# Patient Record
Sex: Male | Born: 1975 | Race: White | Hispanic: No | Marital: Married | Smoking: Current every day smoker
Health system: Southern US, Community
[De-identification: ages and names within clinical notes are randomized; demographics above are authoritative.]

## PROBLEM LIST (undated history)

## (undated) DIAGNOSIS — E119 Type 2 diabetes mellitus without complications: Secondary | ICD-10-CM

---

## 2020-10-09 ENCOUNTER — Inpatient Hospital Stay (HOSPITAL_BASED_OUTPATIENT_CLINIC_OR_DEPARTMENT_OTHER)
Admission: EM | Admit: 2020-10-09 | Discharge: 2020-10-10 | DRG: 066 | Disposition: A | Discharge status: Home / Self Care | Payer: Commercial Managed Care - PPO | Attending: Neurology | Admitting: Neurology

## 2020-10-09 ENCOUNTER — Emergency Department (HOSPITAL_BASED_OUTPATIENT_CLINIC_OR_DEPARTMENT_OTHER): Payer: Commercial Managed Care - PPO

## 2020-10-09 ENCOUNTER — Other Ambulatory Visit: Payer: Self-pay

## 2020-10-09 ENCOUNTER — Encounter (HOSPITAL_BASED_OUTPATIENT_CLINIC_OR_DEPARTMENT_OTHER): Payer: Self-pay | Admitting: *Deleted

## 2020-10-09 DIAGNOSIS — R270 Ataxia, unspecified: Secondary | ICD-10-CM

## 2020-10-09 DIAGNOSIS — I10 Essential (primary) hypertension: Secondary | ICD-10-CM | POA: Diagnosis present

## 2020-10-09 DIAGNOSIS — I651 Occlusion and stenosis of basilar artery: Secondary | ICD-10-CM | POA: Diagnosis not present

## 2020-10-09 DIAGNOSIS — I6381 Other cerebral infarction due to occlusion or stenosis of small artery: Principal | ICD-10-CM | POA: Diagnosis present

## 2020-10-09 DIAGNOSIS — R4789 Other speech disturbances: Secondary | ICD-10-CM

## 2020-10-09 DIAGNOSIS — I639 Cerebral infarction, unspecified: Secondary | ICD-10-CM

## 2020-10-09 DIAGNOSIS — F17210 Nicotine dependence, cigarettes, uncomplicated: Secondary | ICD-10-CM | POA: Diagnosis present

## 2020-10-09 DIAGNOSIS — E119 Type 2 diabetes mellitus without complications: Secondary | ICD-10-CM | POA: Diagnosis present

## 2020-10-09 DIAGNOSIS — I6389 Other cerebral infarction: Secondary | ICD-10-CM | POA: Diagnosis not present

## 2020-10-09 DIAGNOSIS — R29701 NIHSS score 1: Secondary | ICD-10-CM | POA: Diagnosis present

## 2020-10-09 DIAGNOSIS — Z20822 Contact with and (suspected) exposure to covid-19: Secondary | ICD-10-CM | POA: Diagnosis present

## 2020-10-09 DIAGNOSIS — I6330 Cerebral infarction due to thrombosis of unspecified cerebral artery: Secondary | ICD-10-CM | POA: Insufficient documentation

## 2020-10-09 DIAGNOSIS — R4701 Aphasia: Secondary | ICD-10-CM | POA: Diagnosis present

## 2020-10-09 DIAGNOSIS — Z8249 Family history of ischemic heart disease and other diseases of the circulatory system: Secondary | ICD-10-CM

## 2020-10-09 HISTORY — DX: Type 2 diabetes mellitus without complications: E11.9

## 2020-10-09 LAB — COMPREHENSIVE METABOLIC PANEL
ALT: 19 U/L (ref 0–44)
AST: 17 U/L (ref 15–41)
Albumin: 4 g/dL (ref 3.5–5.0)
Alkaline Phosphatase: 47 U/L (ref 38–126)
Anion gap: 7 (ref 5–15)
BUN: 9 mg/dL (ref 6–20)
CO2: 28 mmol/L (ref 22–32)
Calcium: 9.1 mg/dL (ref 8.9–10.3)
Chloride: 102 mmol/L (ref 98–111)
Creatinine, Ser: 0.91 mg/dL (ref 0.61–1.24)
GFR, Estimated: >60 mL/min (ref >60)
Glucose, Bld: 160 mg/dL — ABNORMAL HIGH (ref 70–99)
Potassium: 4.4 mmol/L (ref 3.5–5.1)
Sodium: 137 mmol/L (ref 135–145)
Total Bilirubin: 0.6 mg/dL (ref 0.3–1.2)
Total Protein: 7.2 g/dL (ref 6.5–8.1)

## 2020-10-09 LAB — RAPID URINE DRUG SCREEN, HOSP PERFORMED
Amphetamines: NOT DETECTED
Barbiturates: NOT DETECTED
Benzodiazepines: NOT DETECTED
Cocaine: NOT DETECTED
Opiates: NOT DETECTED
Tetrahydrocannabinol: NOT DETECTED

## 2020-10-09 LAB — URINALYSIS, ROUTINE W REFLEX MICROSCOPIC
Bilirubin Urine: NEGATIVE
Glucose, UA: NEGATIVE mg/dL
Hgb urine dipstick: NEGATIVE
Ketones, ur: NEGATIVE mg/dL
Leukocytes,Ua: NEGATIVE
Nitrite: NEGATIVE
Protein, ur: NEGATIVE mg/dL
Specific Gravity, Urine: 1.02 (ref 1.005–1.030)
pH: 6 (ref 5.0–8.0)

## 2020-10-09 LAB — DIFFERENTIAL
Abs Immature Granulocytes: 0.04 10*3/uL (ref 0.00–0.07)
Basophils Absolute: 0.1 10*3/uL (ref 0.0–0.1)
Basophils Relative: 1 %
Eosinophils Absolute: 0.5 10*3/uL (ref 0.0–0.5)
Eosinophils Relative: 4 %
Immature Granulocytes: 0 %
Lymphocytes Relative: 33 %
Lymphs Abs: 3.7 10*3/uL (ref 0.7–4.0)
Monocytes Absolute: 0.7 10*3/uL (ref 0.1–1.0)
Monocytes Relative: 6 %
Neutro Abs: 6.3 10*3/uL (ref 1.7–7.7)
Neutrophils Relative %: 56 %

## 2020-10-09 LAB — CBC
HCT: 49.1 % (ref 39.0–52.0)
Hemoglobin: 16.8 g/dL (ref 13.0–17.0)
MCH: 32.4 pg (ref 26.0–34.0)
MCHC: 34.2 g/dL (ref 30.0–36.0)
MCV: 94.8 fL (ref 80.0–100.0)
Platelets: 211 10*3/uL (ref 150–400)
RBC: 5.18 MIL/uL (ref 4.22–5.81)
RDW: 13.1 % (ref 11.5–15.5)
WBC: 11.3 10*3/uL — ABNORMAL HIGH (ref 4.0–10.5)
nRBC: 0 % (ref 0.0–0.2)

## 2020-10-09 LAB — RESP PANEL BY RT-PCR (FLU A&B, COVID) ARPGX2
Influenza A by PCR: NEGATIVE
Influenza B by PCR: NEGATIVE
SARS Coronavirus 2 by RT PCR: NEGATIVE

## 2020-10-09 LAB — PROTIME-INR
INR: 1 (ref 0.8–1.2)
Prothrombin Time: 13.2 seconds (ref 11.4–15.2)

## 2020-10-09 LAB — HEMOGLOBIN A1C
Hgb A1c MFr Bld: 6.4 % — ABNORMAL HIGH (ref 4.8–5.6)
Mean Plasma Glucose: 136.98 mg/dL

## 2020-10-09 LAB — ETHANOL: Alcohol, Ethyl (B): <10 mg/dL (ref <10)

## 2020-10-09 LAB — CBG MONITORING, ED: Glucose-Capillary: 174 mg/dL — ABNORMAL HIGH (ref 70–99)

## 2020-10-09 LAB — APTT: aPTT: 29 seconds (ref 24–36)

## 2020-10-09 MED ORDER — SENNOSIDES-DOCUSATE SODIUM 8.6-50 MG PO TABS: 1.0000 | ORAL_TABLET | Freq: Every evening | ORAL | Status: DC | PRN

## 2020-10-09 MED ORDER — ASPIRIN EC 325 MG PO TBEC
325.0000 mg | DELAYED_RELEASE_TABLET | Freq: Once | ORAL | Status: AC
  Administered 2020-10-09: 14:00:00 325 mg via ORAL
  Filled 2020-10-09: qty 1

## 2020-10-09 MED ORDER — ENOXAPARIN SODIUM 40 MG/0.4ML ~~LOC~~ SOLN
40.0000 mg | Freq: Every day | SUBCUTANEOUS | Status: DC
  Administered 2020-10-10: 11:00:00 40 mg via SUBCUTANEOUS
  Filled 2020-10-09: qty 0.4

## 2020-10-09 MED ORDER — IOHEXOL 350 MG/ML SOLN
100.0000 mL | Freq: Once | INTRAVENOUS | Status: AC | PRN
  Administered 2020-10-09: 12:00:00 100 mL via INTRAVENOUS

## 2020-10-09 MED ORDER — SODIUM CHLORIDE 0.9 % IV SOLN: INTRAVENOUS | Status: DC

## 2020-10-09 MED ORDER — ACETAMINOPHEN 325 MG PO TABS: 650.0000 mg | ORAL_TABLET | ORAL | Status: DC | PRN

## 2020-10-09 MED ORDER — ACETAMINOPHEN 650 MG RE SUPP: 650.0000 mg | RECTAL | Status: DC | PRN

## 2020-10-09 MED ORDER — CLOPIDOGREL BISULFATE 75 MG PO TABS: 75.0000 mg | ORAL_TABLET | Freq: Every day | ORAL | Status: DC

## 2020-10-09 MED ORDER — ASPIRIN 325 MG PO TABS: 325.0000 mg | ORAL_TABLET | Freq: Every day | ORAL | Status: DC

## 2020-10-09 MED ORDER — ATORVASTATIN CALCIUM 80 MG PO TABS: 80.0000 mg | ORAL_TABLET | Freq: Every day | ORAL | Status: DC

## 2020-10-09 MED ORDER — STROKE: EARLY STAGES OF RECOVERY BOOK
Freq: Once | Status: AC
  Administered 2020-10-10: 05:00:00 1
  Filled 2020-10-09 (×2): qty 1

## 2020-10-09 MED ORDER — ASPIRIN 300 MG RE SUPP
300.0000 mg | Freq: Every day | RECTAL | Status: DC
  Filled 2020-10-09 (×2): qty 1

## 2020-10-09 MED ORDER — ACETAMINOPHEN 160 MG/5ML PO SOLN: 650.0000 mg | ORAL | Status: DC | PRN

## 2020-10-09 MED ORDER — CLOPIDOGREL BISULFATE 300 MG PO TABS
300.0000 mg | ORAL_TABLET | Freq: Once | ORAL | Status: AC
  Administered 2020-10-09: 14:00:00 300 mg via ORAL
  Filled 2020-10-09: qty 1

## 2020-10-09 NOTE — ED Notes (Signed)
Diet Sprite and PB crackers provided to pt

## 2020-10-09 NOTE — ED Notes (Signed)
Attempted to call report; this contact RN contact info provided for callback.

## 2020-10-09 NOTE — Consult Note (Signed)
Requesting Physician: Charm Barges    Chief Complaint: Slurred speech, difficulty with gait  I have been asked by Dr. Charm Barges to see this patient in consultation for code stroke.  HPI: Nazier Neyhart is an 44 y.o. male with a history of tobacco abuse and diet controlled DM who reports that last night had the acute onset of fatigue with gait imbalance, bilateral upper extremity weakness, slurred speech and pressure behind the left eye.  With no improvement in symptoms presented to the ED for further evaluation where code stroke was called.  Initial NIHSS of 1.    Date last known well: Date: 10/09/2020 Time last known well: Time: 01:30 tPA Given: No: Outside time window  Past Medical History:  Diagnosis Date  . Diabetes mellitus without complication (HCC)     History reviewed. No pertinent surgical history.  Family history: Father deceased.  Mother alive with HTN  Social History:  reports that he has been smoking cigarettes. He has been smoking about 0.50 packs per day. He has never used smokeless tobacco. He reports that he does not drink alcohol and does not use drugs.  Allergies: No Known Allergies  Medications: No anticoagulation Prior to Admission medications   Not on File    ROS: History obtained from the patient  General ROS: as noted in HPI Psychological ROS: negative for - behavioral disorder, hallucinations, memory difficulties, mood swings or suicidal ideation Ophthalmic ROS: as noted in HPI ENT ROS: negative for - epistaxis, nasal discharge, oral lesions, sore throat, tinnitus or vertigo Allergy and Immunology ROS: negative for - hives or itchy/watery eyes Hematological and Lymphatic ROS: negative for - bleeding problems, bruising or swollen lymph nodes Endocrine ROS: negative for - galactorrhea, hair pattern changes, polydipsia/polyuria or temperature intolerance Respiratory ROS: negative for - cough, hemoptysis, shortness of breath or wheezing Cardiovascular ROS: negative  for - chest pain, dyspnea on exertion, edema or irregular heartbeat Gastrointestinal ROS: negative for - abdominal pain, diarrhea, hematemesis, nausea/vomiting or stool incontinence Genito-Urinary ROS: negative for - dysuria, hematuria, incontinence or urinary frequency/urgency Musculoskeletal ROS: negative for - joint swelling or muscular weakness Neurological ROS: as noted in HPI Dermatological ROS: negative for rash and skin lesion changes  Physical Examination: Blood pressure (!) 130/96, pulse 80, temperature (!) 97.4 F (36.3 C), resp. rate 16, height 5\' 8"  (1.727 m), weight 86.2 kg, SpO2 99 %.  HEENT-  Normocephalic, no lesions, without obvious abnormality.  Normal external eye and conjunctiva.  Normal external nose.  Neurological Examination   Mental Status: Alert, oriented, thought content appropriate.  Frequent halting of speech.  Able to follow commands.   Cranial Nerves: II: Visual fields grossly normal III,IV, VI: Extra-ocular motions intact bilaterally V,VII: smile symmetric, facial light touch sensation normal bilaterally VIII: hearing normal bilaterally XI: bilateral shoulder shrug XII: midline tongue extension Motor: Able to loft all extremities against gravity with no focal weakness appreciated Sensory: Pinprick and light touch intact throughout, bilaterally Cerebellar: normal finger-to-nose and normal heel-to-shin testing bilaterally   NIHSS of 1 for speech    Laboratory Studies:  Basic Metabolic Panel: No results for input(s): NA, K, CL, CO2, GLUCOSE, BUN, CREATININE, CALCIUM, MG, PHOS in the last 168 hours.  Liver Function Tests: No results for input(s): AST, ALT, ALKPHOS, BILITOT, PROT, ALBUMIN in the last 168 hours. No results for input(s): LIPASE, AMYLASE in the last 168 hours. No results for input(s): AMMONIA in the last 168 hours.  CBC: No results for input(s): WBC, NEUTROABS, HGB, HCT, MCV, PLT in the  last 168 hours.  Cardiac Enzymes: No results  for input(s): CKTOTAL, CKMB, CKMBINDEX, TROPONINI in the last 168 hours.  BNP: Invalid input(s): POCBNP  CBG: Recent Labs  Lab 10/09/20 1014  GLUCAP 174*    Microbiology: No results found for this or any previous visit.  Coagulation Studies: No results for input(s): LABPROT, INR in the last 72 hours.  Urinalysis: No results for input(s): COLORURINE, LABSPEC, PHURINE, GLUCOSEU, HGBUR, BILIRUBINUR, KETONESUR, PROTEINUR, UROBILINOGEN, NITRITE, LEUKOCYTESUR in the last 168 hours.  Invalid input(s): APPERANCEUR  Lipid Panel: No results found for: CHOL, TRIG, HDL, CHOLHDL, VLDL, LDLCALC  HgbA1C: No results found for: HGBA1C  Urine Drug Screen:  No results found for: LABOPIA, COCAINSCRNUR, LABBENZ, AMPHETMU, THCU, LABBARB  Alcohol Level: No results for input(s): ETH in the last 168 hours.  Other results: EKG: sinus rhythm at 80 bpm  Imaging: CT HEAD CODE STROKE WO CONTRAST  Result Date: 10/09/2020 CLINICAL DATA:  Code stroke. EXAM: CT HEAD WITHOUT CONTRAST TECHNIQUE: Contiguous axial images were obtained from the base of the skull through the vertex without intravenous contrast. COMPARISON:  None. FINDINGS: Brain: No evidence of acute infarction, hemorrhage, hydrocephalus, extra-axial collection or mass lesion/mass effect. Vascular: No hyperdense vessel or unexpected calcification. Skull: Normal. Negative for fracture or focal lesion. Sinuses/Orbits: No acute finding. Other: None. ASPECTS [facility] Stroke Program Early CT Score) - Ganglionic level infarction (caudate, lentiform nuclei, internal capsule, insula, M1-M3 cortex): 7 - Supraganglionic infarction (M4-M6 cortex): 3 Total score (0-10 with 10 being normal): 10 IMPRESSION: 1. No acute intracranial pathology. 2. ASPECTS is 10. These results were called by telephone at the time of interpretation on 10/09/2020 at 10:38 am to provider [facility] , who verbally acknowledged these results. Electronically Signed   By: Baldemar Lenis M.D.   On: 10/09/2020 10:40    Assessment: 44 y.o. male with a history of tobacco abuse and diet controlled DM who reports that last night had the acute onset of fatigue with gait imbalance, bilateral upper extremity weakness, slurred speech and pressure behind the left eye.  With no improvement in symptoms presented to the ED for further evaluation where code stroke was called.  Initial NIHSS of 1.   Patient outside time window for tPA but due to presenting complaints can not rule out the possibility of a posterior circulation thrombosis.  Further imaging indicated.    Stroke Risk Factors - diabetes mellitus  Plan: 1. HgbA1c, fasting lipid panel 2. CTA of the head and neck STAT 3. Echocardiogram 4. Prophylactic therapy-ASA 325 and Plavix 300mg  now.   5. Telemetry monitoring 6. Frequent neuro checks 7. If no evidence of LVO on CTA  Would proceed with MRI of the brain without contrast and prophylactic therapy  ASA 81mg  and Plavix 75mg  for three weeks with change to ASA 81mg  daily alone as monotherapy after that time.   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  Case discussed with Dr. who has recommended a cerebral arteriogram for tomorrow.  Case also discussed with neurology at [facility] who will accept in transfer (Dr. Thana Farr).    Case discussed with Dr. 10/11/2020  This patient was evaluated using a telemedicine platform with verbal consent from patient prior to evaluation.  115 minutes was spent in consultation with this patient, family and involved caregivers with development of management and treatment plans discussed.  Thana Farr, MD Neurology

## 2020-10-09 NOTE — Progress Notes (Signed)
Patient arrived from [facility] ED by transport to unit A&Ox4 without pain. Patient states numbness & tingling resolved but remains feeling mildly uncoordinated in upper extremities. Page sent to [facility] admission but was informed the patient is admitted by neurology. Oncall neurology paged, awaiting response.

## 2020-10-09 NOTE — H&P (Addendum)
NEUROLOGY H & P NOTE   Date of service: October 09, 2020 Patient Name: Fred Hughes MRN:  [id number] DOB:  29-May-1976 _ _ _   _ __   _ __ _ _  __ __   _ __   __ _  History of Present Illness  Fred Hughes is a 44 y.o. male with PMH significant for DM2 who presents with acute onset incoordination and slurred speech with a LKW of 0130 on 10/09/20.  He was seen on telemetry neurology as a stroke code.  He was outside tPA window. 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  NIH stroke scale was a 1.  Patient reports that he was feeling lethargic and lightheaded last night.  He went to bed at 0130 on 10/09/2020 and woke up with slurred speech and incoordination.  He came into the emergency department when his symptoms persisted. His symptoms have completely resolved.  Transferred to [facility] for further work-up and potential angiogram.  NIH stroke scale: 0 MR S: 0 Last known well: 0130 on 10/09/2020.   tPA: Outside the window. Thrombectomy: Not a candidate due to low NIH stroke scale.  ROS   Constitutional Denies weight loss, fever and chills.   HEENT Denies changes in vision and hearing.   Respiratory Denies SOB and cough.   CV Denies palpitations and CP   GI Denies abdominal pain, nausea, vomiting and diarrhea.   GU Denies dysuria and urinary frequency.   MSK Denies myalgia and joint pain.   Skin Denies rash and pruritus.   Neurological Denies headache and syncope.   Psychiatric Denies recent changes in mood. Denies anxiety and depression.    Past History   Past Medical History:  Diagnosis Date  . Diabetes mellitus without complication (HCC)    History reviewed. No pertinent surgical history. History reviewed. No pertinent family history. Social History   Socioeconomic History  . Marital status: Married    Spouse name: Not on file  . Number of children: Not on file  . Years of education: Not on file  . Highest education level:  Not on file  Occupational History  . Not on file  Tobacco Use  . Smoking status: Current Every Day Smoker    Packs/day: 0.50    Types: Cigarettes  . Smokeless tobacco: Never Used  Substance and Sexual Activity  . Alcohol use: Never  . Drug use: Never  . Sexual activity: Yes  Other Topics Concern  . Not on file  Social History Narrative  . Not on file   Social Determinants of Health   Financial Resource Strain: Not on file  Food Insecurity: Not on file  Transportation Needs: Not on file  Physical Activity: Not on file  Stress: Not on file  Social Connections: Not on file   No Known Allergies  Medications   Medications Prior to Admission  Medication Sig Dispense Refill Last Dose  . famotidine-calcium carbonate-magnesium hydroxide (PEPCID COMPLETE) 10-800-165 MG chewable tablet Chew 1 tablet by mouth daily as needed.   10/08/2020 at Unknown time  . naproxen sodium (ALEVE) 220 MG tablet Take 220 mg by mouth daily as needed.        Vitals   Vitals:   10/09/20 1630 10/09/20 1700 10/09/20 1830 10/09/20 2006  BP: (!) 114/92 113/84 113/82 (!) 142/95  Pulse: 86 81 81 86  Resp: 16 (!) 24 (!) 21 18  Temp:    98.5 F (36.9 C)  SpO2: 100% 97% 97% 100%  Weight:      Height:         Body mass index is 28.89 kg/m.  Physical Exam   General: Laying comfortably in bed; in no acute distress.  HENT: Normal oropharynx and mucosa. Normal external appearance of ears and nose.  Neck: Supple, no pain or tenderness  CV: No JVD. No peripheral edema.  Pulmonary: Symmetric Chest rise. Normal respiratory effort.  Abdomen: Soft to touch, non-tender.  Ext: No cyanosis, edema, or deformity  Skin: No rash. Normal palpation of skin.   Musculoskeletal: Normal digits and nails by inspection. No clubbing.   Neurologic Examination  Mental status/Cognition: Alert, oriented to self, place, month and year, good attention.  Speech/language: Fluent, comprehension intact, object naming intact,  repetition intact.  Cranial nerves:   CN II Pupils equal and reactive to light, no VF deficits    CN III,IV,VI EOM intact, no gaze preference or deviation, no nystagmus    CN V normal sensation in V1, V2, and V3 segments bilaterally    CN VII no asymmetry, no nasolabial fold flattening    CN VIII normal hearing to speech    CN IX & X normal palatal elevation, no uvular deviation    CN XI 5/5 head turn and 5/5 shoulder shrug bilaterally    CN XII midline tongue protrusion    Motor:  Muscle bulk: normal, tone normal, pronator drift none  tremor none Mvmt Root Nerve  Muscle Right Left Comments  SA C5/6 Ax Deltoid 5 5   EF C5/6 Mc Biceps 5 5   EE C6/7/8 Rad Triceps 5 5   WF C6/7 Med FCR 5 5   WE C7/8 PIN ECU 5 5   F Ab C8/T1 U ADM/FDI 5 5   HF L1/2/3 Fem Illopsoas 5 5   KE L2/3/4 Fem Quad 5 5   DF L4/5 D Peron Tib Ant 5 5   PF S1/2 Tibial Grc/Sol 5 5    Reflexes:  Right Left Comments  Pectoralis      Biceps (C5/6) 2 2   Brachioradialis (C5/6) 2 2    Triceps (C6/7) 1 1    Patellar (L3/4) 2 2    Achilles (S1) 1 1    Hoffman      Plantar     Jaw jerk    Sensation:  Light touch Intact throughout   Pin prick    Temperature    Vibration   Proprioception    Coordination/Complex Motor:  - Finger to Nose intact BL - Heel to shin intact BL - Rapid alternating movement are normal - Gait: deferred.  NIHSS components Score: Comment  1a Level of Conscious 0[x]  1[]  2[]  3[]      1b LOC Questions 0[x]  1[]  2[]       1c LOC Commands 0[x]  1[]  2[]       2 Best Gaze 0[x]  1[]  2[]       3 Visual 0[x]  1[]  2[]  3[]      4 Facial Palsy 0[x]  1[]  2[]  3[]      5a Motor Arm - left 0[x]  1[]  2[]  3[]  4[]  UN[]    5b Motor Arm - Right 0[x]  1[]  2[]  3[]  4[]  UN[]    6a Motor Leg - Left 0[x]  1[]  2[]  3[]  4[]  UN[]    6b Motor Leg - Right 0[x]  1[]  2[]  3[]  4[]  UN[]    7 Limb Ataxia 0[x]  1[]  2[]  3[]  UN[]     8 Sensory 0[x]  1[]  2[]  UN[]      9 Best  Language 0[x]  1[]  2[]  3[]      10 Dysarthria 0[x]  1[]  2[]  UN[]       11 Extinct. and Inattention 0[x]  1[]  2[]       TOTAL: 0      Labs   CBC:  Recent Labs  Lab 10/09/20 1019  WBC 11.3*  NEUTROABS 6.3  HGB 16.8  HCT 49.1  MCV 94.8  PLT 211    Basic Metabolic Panel:  Lab Results  Component Value Date   NA 137 10/09/2020   K 4.4 10/09/2020   CO2 28 10/09/2020   GLUCOSE 160 (H) 10/09/2020   BUN 9 10/09/2020   CREATININE 0.91 10/09/2020   CALCIUM 9.1 10/09/2020   GFRNONAA >60 10/09/2020   Lipid Panel: No results found for: LDLCALC HgbA1c:  Lab Results  Component Value Date   HGBA1C 6.4 (H) 10/09/2020   Urine Drug Screen:     Component Value Date/Time   LABOPIA NONE DETECTED 10/09/2020 1050   COCAINSCRNUR NONE DETECTED 10/09/2020 1050   LABBENZ NONE DETECTED 10/09/2020 1050   AMPHETMU NONE DETECTED 10/09/2020 1050   THCU NONE DETECTED 10/09/2020 1050   LABBARB NONE DETECTED 10/09/2020 1050    Alcohol Level     Component Value Date/Time   ETH <10 10/09/2020 1136    CT Head without contrast: CTH was negative for a large hypodensity concerning for a large territory infarct or hyperdensity concerning for an ICH  CT angio Head and Neck with contrast: 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  MRI Brain pending   Impression   Fred Hughes is a 44 y.o. 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  In terms are concerning for potential TIA.  We will get stroke work-up and a CT angiogram to assess the extent of the stenosis.  Recommendations  Plan:  I ordered following: - Frequent Neuro checks per stroke unit protocol - MRI Brain without contrast - TTE pending - Lipid panel with LDL - will start statins if LDL > 70 - HbA1c - Antithrombotic - Aspirin 81mg  and plavix 75mg  daily x 3 months, then aspirin 81mg  daily alone. DVT PPX - SBP goal:  permissive hypertension first 24 h < 220/110. Held home meds.  - Telemetry monitoring for arrythmia - Bedside swallow screen prior to PO intake. - Stroke education booklet - PT/OT/SLP consult - Strict bedrest for 24 hours - IV Fluids at 67ml/hr. - NPO midnight for potential angiogram in AM - Will need to consult Neurointerventional Radiology in AM  ____________________________________________________________________  Signed,  [facility] Pager Number _ _ _   _ __   _ __ _ _  __ __   _ __   __ _

## 2020-10-09 NOTE — ED Triage Notes (Signed)
Had weakness & vertigo last night.  Patient noticed slurred speech this morning and numbness to his lips and arms.

## 2020-10-09 NOTE — ED Notes (Signed)
Hgb A1c added to previous blood draw.  Lab aware.

## 2020-10-09 NOTE — ED Provider Notes (Signed)
[facility] EMERGENCY DEPARTMENT Provider Note   CSN: [id number] Arrival date & time: 10/09/20  1002     History Chief Complaint  Patient presents with  . Aphasia  . Numbness    Fred Hughes is a 44 y.o. male.  He has a history of diabetes and is diet controlled.  He said around 1:30 AM today he felt acutely fatigued and had some imbalance walking.  Same when he woke up this morning also with some word finding difficulties.  He said he feels weak in his arms and they take more effort to lift.  Also notices some tingling around his mouth.  Some sinus pressure behind his left eye.  No blurry vision double vision.  No head injury.  No prior neurologic history. Code stroke activated as possible LVO.  The history is provided by the patient.  Cerebrovascular Accident The current episode started 6 to 12 hours ago. The problem occurs constantly. The problem has not changed since onset.Associated symptoms include headaches. Pertinent negatives include no chest pain, no abdominal pain and no shortness of breath. Nothing aggravates the symptoms. Nothing relieves the symptoms. He has tried nothing for the symptoms. The treatment provided no relief.       Past Medical History:  Diagnosis Date  . Diabetes mellitus without complication (HCC)     There are no problems to display for this patient.   History reviewed. No pertinent surgical history.     History reviewed. No pertinent family history.  Social History   Tobacco Use  . Smoking status: Current Every Day Smoker    Packs/day: 0.50    Types: Cigarettes  . Smokeless tobacco: Never Used  Substance Use Topics  . Alcohol use: Never  . Drug use: Never    Home Medications Prior to Admission medications   Not on File    Allergies    Patient has no known allergies.  Review of Systems   Review of Systems  Constitutional: Negative for fever.  HENT: Negative for sore throat.   Eyes: Negative for visual disturbance.   Respiratory: Negative for shortness of breath.   Cardiovascular: Negative for chest pain.  Gastrointestinal: Negative for abdominal pain.  Genitourinary: Negative for dysuria.  Musculoskeletal: Negative for neck pain.  Skin: Negative for rash.  Neurological: Positive for dizziness, speech difficulty, weakness, numbness and headaches. Negative for facial asymmetry.    Physical Exam Updated Vital Signs BP (!) 130/96 (BP Location: Right Arm)   Pulse 80   Temp (!) 97.4 F (36.3 C)   Resp 16   Ht 5\' 8"  (1.727 m)   Wt 86.2 kg   SpO2 99%   BMI 28.89 kg/m   Physical Exam Vitals and nursing note reviewed.  Constitutional:      Appearance: Normal appearance. He is well-developed and well-nourished.  HENT:     Head: Normocephalic and atraumatic.  Eyes:     Conjunctiva/sclera: Conjunctivae normal.  Cardiovascular:     Rate and Rhythm: Normal rate and regular rhythm.     Heart sounds: No murmur heard.   Pulmonary:     Effort: Pulmonary effort is normal. No respiratory distress.     Breath sounds: Normal breath sounds.  Abdominal:     Palpations: Abdomen is soft.     Tenderness: There is no abdominal tenderness.  Musculoskeletal:        General: No edema.     Cervical back: Neck supple.  Skin:    General: Skin is warm and dry.  Neurological:     Mental Status: He is alert.     Sensory: No sensory deficit.     Motor: No weakness.     Comments: Patient is awake and alert.  Cranial nerves II through XII intact other than has some difficulty with word finding.  Strength 5 out of 5 upper and lower extremities.  Finger-to-nose preserved although patient says it feels slightly difficult to do.  Gait deferred.  Psychiatric:        Mood and Affect: Mood and affect normal.     ED Results / Procedures / Treatments   Labs (all labs ordered are listed, but only abnormal results are displayed) Labs Reviewed  CBC - Abnormal; Notable for the following components:      Result Value    WBC 11.3 (*)    All other components within normal limits  COMPREHENSIVE METABOLIC PANEL - Abnormal; Notable for the following components:   Glucose, Bld 160 (*)    All other components within normal limits  CBG MONITORING, ED - Abnormal; Notable for the following components:   Glucose-Capillary 174 (*)    All other components within normal limits  RESP PANEL BY RT-PCR (FLU A&B, COVID) ARPGX2  URINALYSIS, ROUTINE W REFLEX MICROSCOPIC  ETHANOL  PROTIME-INR  APTT  DIFFERENTIAL  RAPID URINE DRUG SCREEN, HOSP PERFORMED  HEMOGLOBIN A1C    EKG EKG Interpretation  Date/Time:  Monday October 09 2020 10:12:16 EST Ventricular Rate:  80 PR Interval:    QRS Duration: 94 QT Interval:  366 QTC Calculation: 423 R Axis:   18 Text Interpretation: Sinus rhythm No old tracing to compare Confirmed by Meridee Score [phone]) on 10/09/2020 10:35:17 AM   Radiology CT HEAD CODE STROKE WO CONTRAST  Result Date: 10/09/2020 CLINICAL DATA:  Code stroke. EXAM: CT HEAD WITHOUT CONTRAST TECHNIQUE: Contiguous axial images were obtained from the base of the skull through the vertex without intravenous contrast. COMPARISON:  None. FINDINGS: Brain: No evidence of acute infarction, hemorrhage, hydrocephalus, extra-axial collection or mass lesion/mass effect. Vascular: No hyperdense vessel or unexpected calcification. Skull: Normal. Negative for fracture or focal lesion. Sinuses/Orbits: No acute finding. Other: None. ASPECTS [facility] Stroke Program Early CT Score) - Ganglionic level infarction (caudate, lentiform nuclei, internal capsule, insula, M1-M3 cortex): 7 - Supraganglionic infarction (M4-M6 cortex): 3 Total score (0-10 with 10 being normal): 10 IMPRESSION: 1. No acute intracranial pathology. 2. ASPECTS is 10. These results were called by telephone at the time of interpretation on 10/09/2020 at 10:38 am to provider [facility] , who verbally acknowledged these results. Electronically Signed   By: Baldemar Lenis M.D.   On: 10/09/2020 10:40   CT ANGIO HEAD CODE STROKE  Result Date: 10/09/2020 CLINICAL DATA:  Stroke suspected. Slurred speech. Numbness to lips and arms. EXAM: CT ANGIOGRAPHY HEAD AND NECK TECHNIQUE: Multidetector CT imaging of the head and neck was performed using the standard protocol during bolus administration of intravenous contrast. Multiplanar CT image reconstructions and MIPs were obtained to evaluate the vascular anatomy. Carotid stenosis measurements (when applicable) are obtained utilizing NASCET criteria, using the distal internal carotid diameter as the denominator. CONTRAST:  OMNIPAQUE IOHEXOL 350 MG/ML SOLN COMPARISON:  Same day head CT. FINDINGS: CTA NECK FINDINGS Aortic arch: Evaluation is in by streak artifact. Great vessel origins are patent. Right carotid system: No evidence of dissection, stenosis (50% or greater) or occlusion. Left carotid system: No evidence of dissection, stenosis (50% or greater) or occlusion. Vertebral arteries: Left dominant. The  right vertebral artery is diminutive throughout its course. No evidence of dissection, stenosis (50% or greater) or occlusion. Skeleton: Focal moderate to severe no acute findings. Degenerative disc disease at C6-C7. Other neck: No mass or suspicious adenopathy. Upper chest: No acute findings. Dependent atelectasis without consolidation. Review of the MIP images confirms the above findings CTA HEAD FINDINGS Anterior circulation: No evidence of large vessel occlusion or proximal hemodynamically significant stenosis. Evaluation of the more distal vasculature is somewhat limited by venous contamination. No aneurysm. Posterior circulation: Intradural vertebral arteries are patent. 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 Bilateral posterior cerebral arteries are patent without evidence of occlusion or hemodynamically significant stenosis proximally. Somewhat  limited evaluation of more distal PCA secondary to venous contamination. The right P1 PCA is somewhat small, favored to relate to fetal like right PCA with right posterior communicating artery. No aneurysm. Venous sinuses: As permitted by contrast timing, patent. Review of the MIP images confirms the above findings IMPRESSION: 1. 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 2. No evidence of hemodynamically significant stenosis in the neck. Findings discussed with Dr. Charm Barges at 10:42 p.m. via telephone. Electronically Signed   By: Feliberto Harts MD   On: 10/09/2020 12:47   CT ANGIO NECK CODE STROKE  Result Date: 10/09/2020 CLINICAL DATA:  Stroke suspected. Slurred speech. Numbness to lips and arms. EXAM: CT ANGIOGRAPHY HEAD AND NECK TECHNIQUE: Multidetector CT imaging of the head and neck was performed using the standard protocol during bolus administration of intravenous contrast. Multiplanar CT image reconstructions and MIPs were obtained to evaluate the vascular anatomy. Carotid stenosis measurements (when applicable) are obtained utilizing NASCET criteria, using the distal internal carotid diameter as the denominator. CONTRAST:  OMNIPAQUE IOHEXOL 350 MG/ML SOLN COMPARISON:  Same day head CT. FINDINGS: CTA NECK FINDINGS Aortic arch: Evaluation is in by streak artifact. Great vessel origins are patent. Right carotid system: No evidence of dissection, stenosis (50% or greater) or occlusion. Left carotid system: No evidence of dissection, stenosis (50% or greater) or occlusion. Vertebral arteries: Left dominant. The right vertebral artery is diminutive throughout its course. No evidence of dissection, stenosis (50% or greater) or occlusion. Skeleton: Focal moderate to severe no acute findings. Degenerative disc disease at C6-C7. Other neck: No mass or suspicious adenopathy. Upper chest: No acute findings. Dependent atelectasis without consolidation. Review of the MIP images confirms the  above findings CTA HEAD FINDINGS Anterior circulation: No evidence of large vessel occlusion or proximal hemodynamically significant stenosis. Evaluation of the more distal vasculature is somewhat limited by venous contamination. No aneurysm. Posterior circulation: Intradural vertebral arteries are patent. 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 Bilateral posterior cerebral arteries are patent without evidence of occlusion or hemodynamically significant stenosis proximally. Somewhat limited evaluation of more distal PCA secondary to venous contamination. The right P1 PCA is somewhat small, favored to relate to fetal like right PCA with right posterior communicating artery. No aneurysm. Venous sinuses: As permitted by contrast timing, patent. Review of the MIP images confirms the above findings IMPRESSION: 1. 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 2. No evidence of hemodynamically significant stenosis in the neck. Findings discussed with Dr. Charm Barges at 10:42 p.m. via telephone. Electronically Signed   By: Feliberto Harts MD   On: 10/09/2020 12:47    Procedures Procedures (including critical care time)  Medications Ordered  in ED Medications  iohexol (OMNIPAQUE) 350 MG/ML injection 100 mL (100 mLs Intravenous Contrast Given 10/09/20 1207)  aspirin EC tablet 325 mg (325 mg Oral Given 10/09/20 1425)  clopidogrel (PLAVIX) tablet 300 mg (300 mg Oral Given 10/09/20 1425)    ED Course  I have reviewed the triage vital signs and the nursing notes.  Pertinent labs & imaging results that were available during my care of the patient were reviewed by me and considered in my medical decision making (see chart for details).  Clinical Course as of 10/09/20 1702  Mon Oct 09, 2020  1039 Activated as code stroke has may be LVO.  Symptoms started at 1:30 AM.  Received report from radiology that head CT is negative. [MB]  1243 Received a  call from radiology that he is seeing some moderate to severe basilar narrowing, thinks it is more atherosclerotic. [MB]  1243 Awaiting final recommendations from Dr. Thad Rangereynolds. [MB]  1409 Dr. Thad Rangereynolds neurology spoke with interventional radiology regarding this case.  They want to do an angiogram tomorrow.  Patient [facility].  Telemetry.  N.p.o. after midnight.  I reviewed this with the patient he is agreeable to plan for admission. [MB]    Clinical Course User Index [MB] Terrilee FilesButler, Charita Lindenberger C, MD   MDM Rules/Calculators/A&P                         This patient complains of speech difficulty, unbalanced, weakness upper extremities; this involves an extensive number of treatment Options and is a complaint that carries with it a high risk of complications and Morbidity. The differential includes stroke, bleed, hypoglycemia, metabolic derangement, arrhythmia  I ordered, reviewed and interpreted labs, which included CBC with mildly elevated white count, normal hemoglobin, chemistries and LFTs normal other than elevated glucose, normal coags, urinalysis negative, Covid testing negative I ordered medication aspirin and Plavix per neurology recommendations I ordered imaging studies which included CT head and CT angio head and neck and I independently    visualized and interpreted imaging which showed basilar narrowing Additional history obtained from patient's spouse Previous records obtained and reviewed in epic, none I consulted neurology Dr. Thad Rangereynolds and discussed lab and imaging findings  Critical Interventions: None  After the interventions stated above, I reevaluated the patient and found patient still to be symptomatic.  Reviewed neurology recommendations with patient and he is agreeable to admission.   Final Clinical Impression(s) / ED Diagnoses Final diagnoses:  Ataxia  Word finding difficulty    Rx / DC Orders ED Discharge Orders    None        Terrilee FilesButler, Cristyn Crossno C, MD 10/09/20 1705

## 2020-10-09 NOTE — ED Notes (Signed)
Pt requesting something to eat; will remain NPO at this time per EDP; pt and visitor updated

## 2020-10-10 ENCOUNTER — Inpatient Hospital Stay (HOSPITAL_COMMUNITY): Payer: Commercial Managed Care - PPO

## 2020-10-10 DIAGNOSIS — R270 Ataxia, unspecified: Secondary | ICD-10-CM

## 2020-10-10 DIAGNOSIS — I6389 Other cerebral infarction: Secondary | ICD-10-CM

## 2020-10-10 DIAGNOSIS — I6330 Cerebral infarction due to thrombosis of unspecified cerebral artery: Secondary | ICD-10-CM | POA: Insufficient documentation

## 2020-10-10 LAB — CBC
HCT: 47.2 % (ref 39.0–52.0)
Hemoglobin: 16 g/dL (ref 13.0–17.0)
MCH: 32.3 pg (ref 26.0–34.0)
MCHC: 33.9 g/dL (ref 30.0–36.0)
MCV: 95.2 fL (ref 80.0–100.0)
Platelets: 195 10*3/uL (ref 150–400)
RBC: 4.96 MIL/uL (ref 4.22–5.81)
RDW: 13 % (ref 11.5–15.5)
WBC: 9 10*3/uL (ref 4.0–10.5)
nRBC: 0 % (ref 0.0–0.2)

## 2020-10-10 LAB — LIPID PANEL
Cholesterol: 193 mg/dL (ref 0–200)
HDL: 39 mg/dL — ABNORMAL LOW (ref >40)
LDL Cholesterol: 136 mg/dL — ABNORMAL HIGH (ref 0–99)
Total CHOL/HDL Ratio: 4.9 RATIO
Triglycerides: 88 mg/dL (ref <150)
VLDL: 18 mg/dL (ref 0–40)

## 2020-10-10 LAB — CREATININE, SERUM
Creatinine, Ser: 0.88 mg/dL (ref 0.61–1.24)
GFR, Estimated: >60 mL/min (ref >60)

## 2020-10-10 LAB — ECHOCARDIOGRAM COMPLETE
Area-P 1/2: 3.99 cm2
Height: 68 in
S' Lateral: 2.9 cm
Weight: 3040 oz

## 2020-10-10 LAB — HIV ANTIBODY (ROUTINE TESTING W REFLEX): HIV Screen 4th Generation wRfx: NONREACTIVE

## 2020-10-10 MED ORDER — CLOPIDOGREL BISULFATE 75 MG PO TABS: 75.0000 mg | ORAL_TABLET | Freq: Every day | ORAL | 1 refills | Status: AC

## 2020-10-10 MED ORDER — ATORVASTATIN CALCIUM 80 MG PO TABS: 80.0000 mg | ORAL_TABLET | Freq: Every day | ORAL | 1 refills | Status: AC

## 2020-10-10 MED ORDER — ASPIRIN 325 MG PO TABS: 325.0000 mg | ORAL_TABLET | Freq: Every day | ORAL | 1 refills | Status: AC

## 2020-10-10 NOTE — Progress Notes (Signed)
OT Screen  Patient Details Name: Fred Hughes MRN: [id number] DOB: 08-21-1976  Per PT report and conversation with patient, he is currently functioning at baseline and does not require acute OT assessment or treatment at this time. OT to sign off. Please refer to PT eval for additional information.   Kallie Edward OTR/L Supplemental OT, Department of rehab services [phone]  Lailoni Baquera R H. 10/10/2020, 1:28 PM

## 2020-10-10 NOTE — Progress Notes (Signed)
Echocardiogram 2D Echocardiogram has been performed.  Fred Hughes 10/10/2020, 3:01 PM

## 2020-10-10 NOTE — Progress Notes (Signed)
OT Cancellation Note  Patient Details Name: Owain Eckerman MRN: [id number] DOB: [birth year]   Cancelled Treatment:    Reason Eval/Treat Not Completed: Active bedrest order. OT will check back as time allows.   Kallie Edward OTR/L Supplemental OT, Department of rehab services [phone]  Franshesca Chipman R H. 10/10/2020, 9:27 AM

## 2020-10-10 NOTE — Evaluation (Signed)
Speech Language Pathology Evaluation Patient Details Name: Terrez Ander MRN: [id number] DOB: 10/09/1976 Today's Date: 10/10/2020 Time: 4431-5400 SLP Time Calculation (min) (ACUTE ONLY): 10 min  Problem List:  Patient Active Problem List   Diagnosis Date Noted  . 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 Ataxia 10/09/2020  . 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 Diabetes mellitus without complication [facility])    Past Surgical History: History reviewed. No pertinent surgical history. HPI:  Maurice Fotheringham is an 44 y.o. male with a history of tobacco abuse and diet controlled DM who reports that last night had the acute onset of fatigue with gait imbalance, bilateral upper extremity weakness, slurred speech and pressure behind the left eye. MRI +9 mm acute ischemic nonhemorrhagic left paramedian pontine infarct.   Assessment / Plan / Recommendation Clinical Impression   Pt presents with no focal oral motor deficits.  His speech is fluent and free from dysarthria or word finding impairment.  Pt's performance was [facility] on all administered subtests of the [facility]. [employer] Mental State Exam (SLUMS).  Test was not administered in its entirety (written portions were eliminated) as pt had to lay flat during today's evaluation per RN.  Pt denies any acute cognitive-linguistic changes but complains of feeling very fatigued.   Pt also passed [facility] swallow screen.  As a result, no further ST needs are warranted at this time but please feel free to reconsult should additional needs arise.      SLP Assessment  SLP Recommendation/Assessment: Patient does not need any further Speech Lanaguage Pathology Services    Follow Up Recommendations  None    Frequency and Duration           SLP Evaluation Cognition  Overall Cognitive Status: Within Functional Limits for tasks assessed       Comprehension  Auditory  Comprehension Overall Auditory Comprehension: Appears within functional limits for tasks assessed    Expression Expression Primary Mode of Expression: Verbal Verbal Expression Overall Verbal Expression: Appears within functional limits for tasks assessed   Oral / Motor  Oral Motor/Sensory Function Overall Oral Motor/Sensory Function: Within functional limits Motor Speech Overall Motor Speech: Appears within functional limits for tasks assessed   GO                    Maryjane Hurter 10/10/2020, 9:11 AM

## 2020-10-10 NOTE — Progress Notes (Addendum)
STROKE TEAM PROGRESS NOTE   INTERVAL HISTORY His wife is at the bedside.  I have personally reviewed history of presenting illness, electronic medical records and imaging films in PACS.  He presented with sudden onset of dizziness, ataxia and slurred speech appears to be improving.  MRI scan shows a tiny punctate left paramedian pontine infarct likely lacunar in nature.  CT angiogram had been reported as showing focal mid basilar stenosis but MRI scan T2 coronal images flow-voids appear quite patent without any flow limitation.  LDL cholesterol is elevated at 136 mg percent and hemoglobin A1c 8 is borderline 6.4.  Echocardiogram is pending Vitals:   10/09/20 2006 10/10/20 0207 10/10/20 0355 10/10/20 0517  BP: (!) 142/95 127/87 124/80 122/83  Pulse: 86 63 61 65  Resp: 18 18 20 18   Temp: 98.5 F (36.9 C) 97.9 F (36.6 C) 97.8 F (36.6 C) 98.1 F (36.7 C)  TempSrc: Oral   Oral  SpO2: 100% 98% 99% 96%  Weight:      Height:       CBC:  Recent Labs  Lab 10/09/20 1019 10/10/20 0104  WBC 11.3* 9.0  NEUTROABS 6.3  --   HGB 16.8 16.0  HCT 49.1 47.2  MCV 94.8 95.2  PLT 211 195   Basic Metabolic Panel:  Recent Labs  Lab 10/09/20 1019 10/10/20 0104  NA 137  --   K 4.4  --   CL 102  --   CO2 28  --   GLUCOSE 160*  --   BUN 9  --   CREATININE 0.91 0.88  CALCIUM 9.1  --    Lipid Panel:  Recent Labs  Lab 10/10/20 0104  CHOL 193  TRIG 88  HDL 39*  CHOLHDL 4.9  VLDL 18  LDLCALC 10/12/20*   HgbA1c:  Recent Labs  Lab 10/09/20 1020  HGBA1C 6.4*   Urine Drug Screen:  Recent Labs  Lab 10/09/20 1050  LABOPIA NONE DETECTED  COCAINSCRNUR NONE DETECTED  LABBENZ NONE DETECTED  AMPHETMU NONE DETECTED  THCU NONE DETECTED  LABBARB NONE DETECTED    Alcohol Level  Recent Labs  Lab 10/09/20 1136  ETH <10    IMAGING past 24 hours MR BRAIN WO CONTRAST  Result Date: 10/10/2020 CLINICAL DATA:  Initial evaluation for acute stroke. EXAM: MRI HEAD WITHOUT CONTRAST TECHNIQUE:  Multiplanar, multiecho pulse sequences of the brain and surrounding structures were obtained without intravenous contrast. COMPARISON:  Prior CT and CTA from 10/09/2020. FINDINGS: Brain: Cerebral volume within normal limits for age. No significant cerebral white matter disease. 9 mm focus of restricted diffusion seen involving the left paramedian pons, consistent with an acute ischemic infarct (series 5, image 65). No associated hemorrhage or mass effect. No other evidence for acute or subacute ischemia. Gray-white matter differentiation otherwise maintained. No encephalomalacia to suggest chronic cortical infarction elsewhere within the brain. No other evidence for acute or chronic intracranial hemorrhage. No mass lesion, midline shift or mass effect. No hydrocephalus or extra-axial fluid collection. Pituitary gland suprasellar region normal. Midline structures intact. Vascular: Major intracranial vascular flow voids are well maintained. Skull and upper cervical spine: Craniocervical junction within normal limits. Bone marrow signal intensity normal. No scalp soft tissue abnormality. Sinuses/Orbits: Globes and orbital soft tissues within normal limits. Left maxillary sinus retention cyst. Paranasal sinuses are otherwise clear. Trace right mastoid effusion noted, of doubtful significance. Inner ear structures grossly normal. Other: None. IMPRESSION: 1. 9 mm acute ischemic nonhemorrhagic left paramedian pontine infarct. 2. Otherwise normal brain  MRI for age. Electronically Signed   By: Rise MuBenjamin  McClintock M.D.   On: 10/10/2020 04:54   CT HEAD CODE STROKE WO CONTRAST  Result Date: 10/09/2020 CLINICAL DATA:  Code stroke. EXAM: CT HEAD WITHOUT CONTRAST TECHNIQUE: Contiguous axial images were obtained from the base of the skull through the vertex without intravenous contrast. COMPARISON:  None. FINDINGS: Brain: No evidence of acute infarction, hemorrhage, hydrocephalus, extra-axial collection or mass lesion/mass  effect. Vascular: No hyperdense vessel or unexpected calcification. Skull: Normal. Negative for fracture or focal lesion. Sinuses/Orbits: No acute finding. Other: None. ASPECTS [facility]([place] Stroke Program Early CT Score) - Ganglionic level infarction (caudate, lentiform nuclei, internal capsule, insula, M1-M3 cortex): 7 - Supraganglionic infarction (M4-M6 cortex): 3 Total score (0-10 with 10 being normal): 10 IMPRESSION: 1. No acute intracranial pathology. 2. ASPECTS is 10. These results were called by telephone at the time of interpretation on 10/09/2020 at 10:38 am to provider [facility] , who verbally acknowledged these results. Electronically Signed   By: Baldemar LenisKatyucia  De Macedo Rodrigues M.D.   On: 10/09/2020 10:40   CT ANGIO HEAD CODE STROKE  Result Date: 10/09/2020 CLINICAL DATA:  Stroke suspected. Slurred speech. Numbness to lips and arms. EXAM: CT ANGIOGRAPHY HEAD AND NECK TECHNIQUE: Multidetector CT imaging of the head and neck was performed using the standard protocol during bolus administration of intravenous contrast. Multiplanar CT image reconstructions and MIPs were obtained to evaluate the vascular anatomy. Carotid stenosis measurements (when applicable) are obtained utilizing NASCET criteria, using the distal internal carotid diameter as the denominator. CONTRAST:  100mL OMNIPAQUE IOHEXOL 350 MG/ML SOLN COMPARISON:  Same day head CT. FINDINGS: CTA NECK FINDINGS Aortic arch: Evaluation is in by streak artifact. Great vessel origins are patent. Right carotid system: No evidence of dissection, stenosis (50% or greater) or occlusion. Left carotid system: No evidence of dissection, stenosis (50% or greater) or occlusion. Vertebral arteries: Left dominant. The right vertebral artery is diminutive throughout its course. No evidence of dissection, stenosis (50% or greater) or occlusion. Skeleton: Focal moderate to severe no acute findings. Degenerative disc disease at C6-C7. Other neck: No mass or  suspicious adenopathy. Upper chest: No acute findings. Dependent atelectasis without consolidation. Review of the MIP images confirms the above findings CTA HEAD FINDINGS Anterior circulation: No evidence of large vessel occlusion or proximal hemodynamically significant stenosis. Evaluation of the more distal vasculature is somewhat limited by venous contamination. No aneurysm. Posterior circulation: Intradural vertebral arteries are patent. 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 Bilateral posterior cerebral arteries are patent without evidence of occlusion or hemodynamically significant stenosis proximally. Somewhat limited evaluation of more distal PCA secondary to venous contamination. The right P1 PCA is somewhat small, favored to relate to fetal like right PCA with right posterior communicating artery. No aneurysm. Venous sinuses: As permitted by contrast timing, patent. Review of the MIP images confirms the above findings IMPRESSION: 1. 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 2. No evidence of hemodynamically significant stenosis in the neck. Findings discussed with Dr. Charm BargesButler at 10:42 p.m. via telephone. Electronically Signed   By: Feliberto HartsFrederick S Jones MD   On: 10/09/2020 12:47   CT ANGIO NECK CODE STROKE  Result Date: 10/09/2020 CLINICAL DATA:  Stroke suspected. Slurred speech. Numbness to lips and arms. EXAM: CT ANGIOGRAPHY HEAD AND NECK TECHNIQUE: Multidetector CT imaging of the head and neck was performed using the standard protocol during bolus administration of intravenous contrast. Multiplanar  CT image reconstructions and MIPs were obtained to evaluate the vascular anatomy. Carotid stenosis measurements (when applicable) are obtained utilizing NASCET criteria, using the distal internal carotid diameter as the denominator. CONTRAST:  OMNIPAQUE IOHEXOL 350 MG/ML SOLN COMPARISON:  Same day head CT. FINDINGS: CTA NECK  FINDINGS Aortic arch: Evaluation is in by streak artifact. Great vessel origins are patent. Right carotid system: No evidence of dissection, stenosis (50% or greater) or occlusion. Left carotid system: No evidence of dissection, stenosis (50% or greater) or occlusion. Vertebral arteries: Left dominant. The right vertebral artery is diminutive throughout its course. No evidence of dissection, stenosis (50% or greater) or occlusion. Skeleton: Focal moderate to severe no acute findings. Degenerative disc disease at C6-C7. Other neck: No mass or suspicious adenopathy. Upper chest: No acute findings. Dependent atelectasis without consolidation. Review of the MIP images confirms the above findings CTA HEAD FINDINGS Anterior circulation: No evidence of large vessel occlusion or proximal hemodynamically significant stenosis. Evaluation of the more distal vasculature is somewhat limited by venous contamination. No aneurysm. Posterior circulation: Intradural vertebral arteries are patent. 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 Bilateral posterior cerebral arteries are patent without evidence of occlusion or hemodynamically significant stenosis proximally. Somewhat limited evaluation of more distal PCA secondary to venous contamination. The right P1 PCA is somewhat small, favored to relate to fetal like right PCA with right posterior communicating artery. No aneurysm. Venous sinuses: As permitted by contrast timing, patent. Review of the MIP images confirms the above findings IMPRESSION: 1. 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 2. No evidence of hemodynamically significant stenosis in the neck. Findings discussed with Dr. Charm Barges at 10:42 p.m. via telephone. Electronically Signed   By: Feliberto Harts MD   On: 10/09/2020 12:47    PHYSICAL EXAM Pleasant middle-age Caucasian male not in distress. . Afebrile. Head is nontraumatic. Neck is supple  without bruit.    Cardiac exam no murmur or gallop. Lungs are clear to auscultation. Distal pulses are well felt. Neurological Exam ;  Awake  Alert oriented x 3. Normal speech and language.eye movements full without nystagmus.fundi were not visualized. Vision acuity and fields appear normal. Hearing is normal. Palatal movements are normal. Face symmetric. Tongue midline. Normal strength, tone, reflexes and coordination. Normal sensation. Gait deferred.  ASSESSMENT/PLAN Mr. Fred Hughes is a 44 y.o. male with history of tobacco abuse, DM (diet controlled)  presenting with fatigue and gait difficulty with slurred speech.   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   ASPECTS 10.   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 No evidence of hemodynamically significant stenosis in the neck.  CTA neck :1. 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 No evidence of hemodynamically significant stenosis in the neck.  MRI : 1. 9 mm acute ischemic nonhemorrhagic left paramedian pontine infarct. Otherwise normal brain MRI for age.  2D Echo pending  LDL 136  HgbA1c 6.4  VTE prophylaxis - Lovenox    Diet   Diet NPO time specified     No antithrombotic prior to admission, now on aspirin 81 mg daily and clopidogrel 75 mg daily to continue for 3 weeks and then ASA alone.   Therapy recommendations:  pending  Disposition:  pending  Hypertension  Home meds:  none  Stable .  Permissive hypertension (OK if < 220/120) but gradually normalize in 5-7 days . Long-term BP goal normotensive  Hyperlipidemia  Home meds:  none , Not resumed in hospital  LDL 136, goal < 70  Atorvastatin 80 mg daily  Continue statin at discharge  Diabetes type II Controlled  Home meds:  none  HgbA1c 6.4, goal < 7.0  CBGs Recent Labs    10/09/20 1014  GLUCAP 174*      Other Stroke Risk Factors  Cigarette smoker advised to stop smoking   Hospital day # 1  Valentina Lucks, MSN, NP-C [facility] [phone] She presented with sudden onset of dizziness, ataxia and dysarthria due to small left pontine lacunar infarct likely from small vessel disease.  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  Recommend aspirin Plavix for 3 weeks followed by aspirin alone.  Check echocardiogram results.  And statin for elevated lipids.  Aggressive risk factor modification.  Physical occupational therapy consult to mobilize out of bed.  Hopefully discharge tomorrow if able to walk safely.  Long discussion with patient and wife and answered questions.  Discussed with neuro IR physician assistant.  Greater than 50% time during this 35-minute visit was spent on counseling and coordination of care about his lacunar stroke and discussion of imaging findings and questions. Delia Heady, MD To contact Stroke Continuity provider, please refer to [email or web address]. After hours, contact General Neurology

## 2020-10-10 NOTE — Progress Notes (Signed)
Pt has been discharged from the unit. All belongings are sent with the patient. Family is at bedside. AVS documentation has been given and reviewed. All IV and tele has been disconnected. Pt sent in good spirits.

## 2020-10-10 NOTE — Evaluation (Signed)
Physical Therapy Evaluation Patient Details Name: Fred Hughes MRN: [id number] DOB: Sep 22, 1976 Today's Date: 10/10/2020   History of Present Illness  Fred Hughes is a 44 y.o. male with PMH significant for DM2, smoker who presents with acute onset incoordination and slurred speech. MRI showed L pontine infarct.  Clinical Impression  Patient evaluated by Physical Therapy with no further acute PT needs identified. All education has been completed and the patient has no further questions. Pt independent with all mobility. Symptoms have resolved except that pt notice some slowed coordination when using cell phone but this improving. Ambulated >300' with independence including fwd walking, bkwd walking, changes in direction, and head turns. Education given for CVA sxs, smoking cessation, and brain recovery.  No further PT or OT needs at this time.  PT/ OT   signing off. Thank you for this referral.     Follow Up Recommendations No PT follow up    Equipment Recommendations  None recommended by PT    Recommendations for Other Services       Precautions / Restrictions Precautions Precautions: None Restrictions Weight Bearing Restrictions: No      Mobility  Bed Mobility Overal bed mobility: Independent                  Transfers Overall transfer level: Independent               General transfer comment: transfers safely no difficulties  Ambulation/Gait Ambulation/Gait assistance: Independent Gait Distance (Feet): 300 Feet Assistive device: None Gait Pattern/deviations: WFL(Within Functional Limits) Gait velocity: WFL Gait velocity interpretation: >4.37 ft/sec, indicative of normal walking speed General Gait Details: safe ambulation with normal pattern, able to perform head turns, ambulate bkwds, make quick turns, and pathfind without difficulty  Stairs            Wheelchair Mobility    Modified Rankin (Stroke Patients Only) Modified Rankin (Stroke  Patients Only) Pre-Morbid Rankin Score: No symptoms Modified Rankin: No symptoms     Balance Overall balance assessment: Independent                                           Pertinent Vitals/Pain Pain Assessment: No/denies pain    Home Living Family/patient expects to be discharged to:: Private residence Living Arrangements: Spouse/significant other Available Help at Discharge: Family;Available PRN/intermittently Type of Home: House Home Access: Stairs to enter Entrance Stairs-Rails: None Entrance Stairs-Number of Steps: 2 Home Layout: One level Home Equipment: None      Prior Function Level of Independence: Independent         Comments: 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, travels a lot, car, plane, Training and development officer Dominance   Dominant Hand: Right    Extremity/Trunk Assessment   Upper Extremity Assessment Upper Extremity Assessment: Overall WFL for tasks assessed    Lower Extremity Assessment Lower Extremity Assessment: Overall WFL for tasks assessed    Cervical / Trunk Assessment Cervical / Trunk Assessment: Normal  Communication   Communication: No difficulties  Cognition Arousal/Alertness: Awake/alert Behavior During Therapy: WFL for tasks assessed/performed Overall Cognitive Status: Within Functional Limits for tasks assessed                                 General Comments: Pt reports feeling a bit "foggy" but WFL with testing  General Comments General comments (skin integrity, edema, etc.): VSS throughout eval including ambulation. Discussed smoking cessation, CVA sign and symptoms, and brain rest.    Exercises     Assessment/Plan    PT Assessment Patent does not need any further PT services  PT Problem List         PT Treatment Interventions      PT Goals (Current goals can be found in the Care Plan section)  Acute Rehab PT Goals Patient Stated Goal: return home ASAP PT Goal Formulation: With patient/family     Frequency     Barriers to discharge        Co-evaluation               AM-PAC PT "6 Clicks" Mobility  Outcome Measure Help needed turning from your back to your side while in a flat bed without using bedrails?: None Help needed moving from lying on your back to sitting on the side of a flat bed without using bedrails?: None Help needed moving to and from a bed to a chair (including a wheelchair)?: None Help needed standing up from a chair using your arms (e.g., wheelchair or bedside chair)?: None Help needed to walk in hospital room?: None Help needed climbing 3-5 steps with a railing? : None 6 Click Score: 24    End of Session Equipment Utilized During Treatment: Gait belt Activity Tolerance: Patient tolerated treatment well Patient left: in chair;with call bell/phone within reach;with family/visitor present Nurse Communication: Mobility status PT Visit Diagnosis: Ataxic gait (R26.0)    Time: 9323-5573 PT Time Calculation (min) (ACUTE ONLY): 26 min   Charges:   PT Evaluation $PT Eval Low Complexity: 1 Low PT Treatments $Gait Training: 8-22 mins        Lyanne Co, PT  Acute Rehab Services  Pager [phone] Office [phone]   Fred Hughes 10/10/2020, 1:28 PM

## 2020-10-10 NOTE — Discharge Summary (Addendum)
Stroke Discharge Summary  Patient ID: Fred Hughes   MRN: [id number]      DOB: [birth year]  Date of Admission: 10/09/2020 Date of Discharge: 10/10/2020  Attending Physician:  Micki Riley, MD, Stroke MD Consultant(s):     None  Patient's PCP:  Mattie Marlin, MD  DISCHARGE DIAGNOSIS:  Active Problems:   Ataxia  Pontine lacunar stroke   Cerebral thrombosis with cerebral infarction   Allergies as of 10/10/2020   No Known Allergies      Medication List     STOP taking these medications    famotidine-calcium carbonate-magnesium hydroxide 10-800-165 MG chewable tablet Commonly known as: PEPCID COMPLETE   naproxen sodium 220 MG tablet Commonly known as: ALEVE       TAKE these medications    aspirin 325 MG tablet Take 1 tablet (325 mg total) by mouth daily. Start taking on: October 11, 2020   atorvastatin 80 MG tablet Commonly known as: LIPITOR Take 1 tablet (80 mg total) by mouth daily. Start taking on: October 11, 2020   clopidogrel 75 MG tablet Commonly known as: PLAVIX Take 1 tablet (75 mg total) by mouth daily. Start taking on: October 11, 2020        LABORATORY STUDIES CBC    Component Value Date/Time   WBC 9.0 10/10/2020 0104   RBC 4.96 10/10/2020 0104   HGB 16.0 10/10/2020 0104   HCT 47.2 10/10/2020 0104   PLT 195 10/10/2020 0104   MCV 95.2 10/10/2020 0104   MCH 32.3 10/10/2020 0104   MCHC 33.9 10/10/2020 0104   RDW 13.0 10/10/2020 0104   LYMPHSABS 3.7 10/09/2020 1019   MONOABS 0.7 10/09/2020 1019   EOSABS 0.5 10/09/2020 1019   BASOSABS 0.1 10/09/2020 1019   CMP    Component Value Date/Time   NA 137 10/09/2020 1019   K 4.4 10/09/2020 1019   CL 102 10/09/2020 1019   CO2 28 10/09/2020 1019   GLUCOSE 160 (H) 10/09/2020 1019   BUN 9 10/09/2020 1019   CREATININE 0.88 10/10/2020 0104   CALCIUM 9.1 10/09/2020 1019   PROT 7.2 10/09/2020 1019   ALBUMIN 4.0 10/09/2020 1019   AST 17 10/09/2020 1019   ALT 19 10/09/2020 1019    ALKPHOS 47 10/09/2020 1019   BILITOT 0.6 10/09/2020 1019   GFRNONAA >60 10/10/2020 0104   COAGS Lab Results  Component Value Date   INR 1.0 10/09/2020   Lipid Panel    Component Value Date/Time   CHOL 193 10/10/2020 0104   TRIG 88 10/10/2020 0104   HDL 39 (L) 10/10/2020 0104   CHOLHDL 4.9 10/10/2020 0104   VLDL 18 10/10/2020 0104   LDLCALC 136 (H) 10/10/2020 0104   HgbA1C  Lab Results  Component Value Date   HGBA1C 6.4 (H) 10/09/2020   Urinalysis    Component Value Date/Time   COLORURINE YELLOW 10/09/2020 1050   APPEARANCEUR CLEAR 10/09/2020 1050   LABSPEC 1.020 10/09/2020 1050   PHURINE 6.0 10/09/2020 1050   GLUCOSEU NEGATIVE 10/09/2020 1050   HGBUR NEGATIVE 10/09/2020 1050   BILIRUBINUR NEGATIVE 10/09/2020 1050   KETONESUR NEGATIVE 10/09/2020 1050   PROTEINUR NEGATIVE 10/09/2020 1050   NITRITE NEGATIVE 10/09/2020 1050   LEUKOCYTESUR NEGATIVE 10/09/2020 1050   Urine Drug Screen     Component Value Date/Time   LABOPIA NONE DETECTED 10/09/2020 1050   COCAINSCRNUR NONE DETECTED 10/09/2020 1050   LABBENZ NONE DETECTED 10/09/2020 1050   AMPHETMU NONE DETECTED 10/09/2020 1050   THCU NONE  DETECTED 10/09/2020 1050   LABBARB NONE DETECTED 10/09/2020 1050    Alcohol Level    Component Value Date/Time   ETH <10 10/09/2020 1136     SIGNIFICANT DIAGNOSTIC STUDIES MR BRAIN WO CONTRAST  Result Date: 10/10/2020 CLINICAL DATA:  Initial evaluation for acute stroke. EXAM: MRI HEAD WITHOUT CONTRAST TECHNIQUE: Multiplanar, multiecho pulse sequences of the brain and surrounding structures were obtained without intravenous contrast. COMPARISON:  Prior CT and CTA from 10/09/2020. FINDINGS: Brain: Cerebral volume within normal limits for age. No significant cerebral white matter disease. 9 mm focus of restricted diffusion seen involving the left paramedian pons, consistent with an acute ischemic infarct (series 5, image 65). No associated hemorrhage or mass effect. No other  evidence for acute or subacute ischemia. Gray-white matter differentiation otherwise maintained. No encephalomalacia to suggest chronic cortical infarction elsewhere within the brain. No other evidence for acute or chronic intracranial hemorrhage. No mass lesion, midline shift or mass effect. No hydrocephalus or extra-axial fluid collection. Pituitary gland suprasellar region normal. Midline structures intact. Vascular: Major intracranial vascular flow voids are well maintained. Skull and upper cervical spine: Craniocervical junction within normal limits. Bone marrow signal intensity normal. No scalp soft tissue abnormality. Sinuses/Orbits: Globes and orbital soft tissues within normal limits. Left maxillary sinus retention cyst. Paranasal sinuses are otherwise clear. Trace right mastoid effusion noted, of doubtful significance. Inner ear structures grossly normal. Other: None. IMPRESSION: 1. 9 mm acute ischemic nonhemorrhagic left paramedian pontine infarct. 2. Otherwise normal brain MRI for age. Electronically Signed   By: Rise Mu M.D.   On: 10/10/2020 04:54   CT HEAD CODE STROKE WO CONTRAST  Result Date: 10/09/2020 CLINICAL DATA:  Code stroke. EXAM: CT HEAD WITHOUT CONTRAST TECHNIQUE: Contiguous axial images were obtained from the base of the skull through the vertex without intravenous contrast. COMPARISON:  None. FINDINGS: Brain: No evidence of acute infarction, hemorrhage, hydrocephalus, extra-axial collection or mass lesion/mass effect. Vascular: No hyperdense vessel or unexpected calcification. Skull: Normal. Negative for fracture or focal lesion. Sinuses/Orbits: No acute finding. Other: None. ASPECTS [facility] Stroke Program Early CT Score) - Ganglionic level infarction (caudate, lentiform nuclei, internal capsule, insula, M1-M3 cortex): 7 - Supraganglionic infarction (M4-M6 cortex): 3 Total score (0-10 with 10 being normal): 10 IMPRESSION: 1. No acute intracranial pathology. 2. ASPECTS is  10. These results were called by telephone at the time of interpretation on 10/09/2020 at 10:38 am to provider [facility] , who verbally acknowledged these results. Electronically Signed   By: Baldemar Lenis M.D.   On: 10/09/2020 10:40   CT ANGIO HEAD CODE STROKE  Result Date: 10/09/2020 CLINICAL DATA:  Stroke suspected. Slurred speech. Numbness to lips and arms. EXAM: CT ANGIOGRAPHY HEAD AND NECK TECHNIQUE: Multidetector CT imaging of the head and neck was performed using the standard protocol during bolus administration of intravenous contrast. Multiplanar CT image reconstructions and MIPs were obtained to evaluate the vascular anatomy. Carotid stenosis measurements (when applicable) are obtained utilizing NASCET criteria, using the distal internal carotid diameter as the denominator. CONTRAST:  OMNIPAQUE IOHEXOL 350 MG/ML SOLN COMPARISON:  Same day head CT. FINDINGS: CTA NECK FINDINGS Aortic arch: Evaluation is in by streak artifact. Great vessel origins are patent. Right carotid system: No evidence of dissection, stenosis (50% or greater) or occlusion. Left carotid system: No evidence of dissection, stenosis (50% or greater) or occlusion. Vertebral arteries: Left dominant. The right vertebral artery is diminutive throughout its course. No evidence of dissection, stenosis (50% or greater) or  occlusion. Skeleton: Focal moderate to severe no acute findings. Degenerative disc disease at C6-C7. Other neck: No mass or suspicious adenopathy. Upper chest: No acute findings. Dependent atelectasis without consolidation. Review of the MIP images confirms the above findings CTA HEAD FINDINGS Anterior circulation: No evidence of large vessel occlusion or proximal hemodynamically significant stenosis. Evaluation of the more distal vasculature is somewhat limited by venous contamination. No aneurysm. Posterior circulation: Intradural vertebral arteries are patent. There is focal moderate to severe  stenosis of the mid basilar artery (see series 7, image 231). Bilateral posterior cerebral arteries are patent without evidence of occlusion or hemodynamically significant stenosis proximally. Somewhat limited evaluation of more distal PCA secondary to venous contamination. The right P1 PCA is somewhat small, favored to relate to fetal like right PCA with right posterior communicating artery. No aneurysm. Venous sinuses: As permitted by contrast timing, patent. Review of the MIP images confirms the above findings IMPRESSION: 1. 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 2. No evidence of hemodynamically significant stenosis in the neck. Findings discussed with Dr. Charm Barges at 10:42 p.m. via telephone. Electronically Signed   By: Feliberto Harts MD   On: 10/09/2020 12:47   CT ANGIO NECK CODE STROKE  Result Date: 10/09/2020 CLINICAL DATA:  Stroke suspected. Slurred speech. Numbness to lips and arms. EXAM: CT ANGIOGRAPHY HEAD AND NECK TECHNIQUE: Multidetector CT imaging of the head and neck was performed using the standard protocol during bolus administration of intravenous contrast. Multiplanar CT image reconstructions and MIPs were obtained to evaluate the vascular anatomy. Carotid stenosis measurements (when applicable) are obtained utilizing NASCET criteria, using the distal internal carotid diameter as the denominator. CONTRAST:  OMNIPAQUE IOHEXOL 350 MG/ML SOLN COMPARISON:  Same day head CT. FINDINGS: CTA NECK FINDINGS Aortic arch: Evaluation is in by streak artifact. Great vessel origins are patent. Right carotid system: No evidence of dissection, stenosis (50% or greater) or occlusion. Left carotid system: No evidence of dissection, stenosis (50% or greater) or occlusion. Vertebral arteries: Left dominant. The right vertebral artery is diminutive throughout its course. No evidence of dissection, stenosis (50% or greater) or occlusion. Skeleton: Focal moderate to severe no acute  findings. Degenerative disc disease at C6-C7. Other neck: No mass or suspicious adenopathy. Upper chest: No acute findings. Dependent atelectasis without consolidation. Review of the MIP images confirms the above findings CTA HEAD FINDINGS Anterior circulation: No evidence of large vessel occlusion or proximal hemodynamically significant stenosis. Evaluation of the more distal vasculature is somewhat limited by venous contamination. No aneurysm. Posterior circulation: Intradural vertebral arteries are patent. 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 Bilateral posterior cerebral arteries are patent without evidence of occlusion or hemodynamically significant stenosis proximally. Somewhat limited evaluation of more distal PCA secondary to venous contamination. The right P1 PCA is somewhat small, favored to relate to fetal like right PCA with right posterior communicating artery. No aneurysm. Venous sinuses: As permitted by contrast timing, patent. Review of the MIP images confirms the above findings IMPRESSION: 1. 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 2. No evidence of hemodynamically significant stenosis in the neck. Findings discussed with Dr. Charm Barges at 10:42 p.m. via telephone. Electronically Signed   By: Feliberto Harts MD   On: 10/09/2020 12:47      HISTORY OF PRESENT ILLNESS 44 y.o. male with PMH significant for DM2 who presents with acute onset incoordination and slurred speech with a LKW  of 0130 on 10/09/20.  He was seen on telemetry neurology as a stroke code.  He was outside tPA window. 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  NIH stroke scale was a 1.   Patient reports that he was feeling lethargic and lightheaded last night.  He went to bed at 0130 on 10/09/2020 and woke up with slurred speech and incoordination.  He came into the emergency department when his symptoms persisted. His  symptoms have completely resolved.   [facility] further work-up and potential angiogram.   NIH stroke scale: 0 MR S: 0 Last known well: 0130 on 10/09/2020.   tPA: Outside the window. Thrombectomy: Not a candidate due to low NIH stroke scale. HOSPITAL COURSE Mr. Darrick HuntsmanWesley Yglesias is a 44 y.o. male with history of tobacco abuse, DM (diet controlled)  presenting with fatigue and gait difficulty with slurred speech.    Stroke:  left paramedian pontine infarct embolic  secondary to small vessel disease source Code Stroke CT head No acute abnormality.   ASPECTS 10.  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 No evidence of hemodynamically significant stenosis in the neck. CTA neck :1. 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 No evidence of hemodynamically significant stenosis in the neck. MRI : 1. 9 mm acute ischemic nonhemorrhagic left paramedian pontine infarct. Otherwise normal brain MRI for age. 2D Echo pending LDL 136 HgbA1c 6.4 VTE prophylaxis - Lovenox       Diet    Diet NPO time specified        No antithrombotic prior to admission, now on aspirin 81 mg daily and clopidogrel 75 mg daily to continue for 3 weeks and then ASA alone.  Therapy recommendations:  none Disposition:  Home   Hypertension Home meds:  none Stable Permissive hypertension (OK if < 220/120) but gradually normalize in 5-7 days Long-term BP goal normotensive   Hyperlipidemia Home meds:  none , Not resumed in hospital LDL 136, goal < 70 Atorvastatin 80 mg daily Continue statin at discharge   Diabetes type II Controlled Home meds:  none HgbA1c 6.4, goal < 7.0 CBGs Recent Labs (last 2 labs)      Recent Labs    10/09/20 1014  GLUCAP 174*      Other Stroke Risk Factors Cigarette smoker advised to stop smoking       DISCHARGE EXAM Blood pressure 113/87, pulse 78, temperature (!) 97.5 F (36.4 C), temperature  source Oral, resp. rate 16, height 5\' 8"  (1.727 m), weight 86.2 kg, SpO2 97 %.   Physical Exam  Constitutional: Appears well-developed and well-nourished.  Psych: Affect appropriate to situation Eyes: Normal external eye and conjunctiva. HENT: Normocephalic, no lesions, without obvious abnormality.   Musculoskeletal-no joint tenderness, deformity or swelling Cardiovascular: Normal rate and regular rhythm.  Respiratory: Effort normal, non-labored breathing saturations WNL GI: Soft.  No distension. There is no tenderness.  Skin: WDI   Neuro:  Mental Status: Alert, oriented, thought content appropriate.  Speech fluent without evidence of aphasia.  Able to follow commands without difficulty. Cranial Nerves: II:  Visual fields grossly normal,  III,IV, VI: ptosis not present, extra-ocular motions intact bilaterally pupils equal, round, reactive to light and accommodation V,VII: smile symmetric, facial light touch sensation normal bilaterally VIII: hearing normal bilaterally IX,X: uvula rises symmetrically XI: bilateral shoulder shrug XII: midline tongue extension Motor: Right : Upper extremity   5/5  Left:     Upper extremity   5/5  Lower extremity   5/5   Lower extremity   5/5 Tone and bulk:normal tone throughout; no atrophy noted Sensory:  light touch intact throughout, bilaterally Plantars: Right: downgoing   Left: downgoing Cerebellar: normal finger-to-nose with eyes open, normal rapid alternating movements and normal heel-to-shin test Gait: deferred  Discharge Diet       Diet   Diet Heart Room service appropriate? Yes; Fluid consistency: Thin   liquids  DISCHARGE PLAN Disposition:  Home aspirin 325 mg daily and clopidogrel 75 mg daily for secondary stroke prevention for 3 weeks then  aspirin alone. Ongoing stroke risk factor control by Primary Care Physician at time of discharge Follow-up PCP Mattie Marlin, MD in 2 weeks. Follow-up in [facility] in 4 weeks, office to schedule an appointment.   30 minutes were spent preparing discharge.  Valentina Lucks, MSN, NP-C [facility] [phone] I have personally obtained history,examined this patient, reviewed notes, independently viewed imaging studies, participated in medical decision making and plan of care.ROS completed by me personally and pertinent positives fully documented  I have made any additions or clarifications directly to the above note. Agree with note above.    Delia Heady, MD Medical Director [facility] Pager: [phone] 10/10/2020 4:24 PM

## 2020-10-10 NOTE — TOC Transition Note (Signed)
Transition of Care [facility]) - CM/SW Discharge Note   Patient Details  Name: Fred Hughes MRN: [id number] Date of Birth: 09-14-1976  Transition of Care [facility] CM/SW Contact:  Kermit Balo, RN Phone Number: 10/10/2020, 3:04 PM   Clinical Narrative:    Pt is discharging home with self care. No f/u per PT.  Pt has transportation home.    Final next level of care: Home/Self Care Barriers to Discharge: No Barriers Identified   Patient Goals and CMS Choice        Discharge Placement                       Discharge Plan and Services                                     Social Determinants of Health (SDOH) Interventions     Readmission Risk Interventions No flowsheet data found.

## 2020-10-11 ENCOUNTER — Other Ambulatory Visit (HOSPITAL_COMMUNITY): Payer: Self-pay | Admitting: Interventional Radiology

## 2020-10-11 DIAGNOSIS — I771 Stricture of artery: Secondary | ICD-10-CM

## 2020-10-23 ENCOUNTER — Ambulatory Visit (HOSPITAL_COMMUNITY): Admission: RE | Admit: 2020-10-23 | Payer: Commercial Managed Care - PPO | Source: Ambulatory Visit

## 2020-11-02 ENCOUNTER — Ambulatory Visit (HOSPITAL_COMMUNITY)
Admission: RE | Admit: 2020-11-02 | Discharge: 2020-11-02 | Disposition: A | Discharge status: Home / Self Care | Payer: Commercial Managed Care - PPO | Source: Ambulatory Visit | Attending: Interventional Radiology | Admitting: Interventional Radiology

## 2020-11-02 ENCOUNTER — Other Ambulatory Visit: Payer: Self-pay

## 2020-11-02 DIAGNOSIS — I771 Stricture of artery: Secondary | ICD-10-CM

## 2020-11-06 HISTORY — PX: IR RADIOLOGIST EVAL & MGMT: IMG5224

## 2020-11-21 ENCOUNTER — Other Ambulatory Visit (HOSPITAL_COMMUNITY): Payer: Self-pay | Admitting: Interventional Radiology

## 2020-11-21 DIAGNOSIS — I771 Stricture of artery: Secondary | ICD-10-CM

## 2020-12-13 ENCOUNTER — Other Ambulatory Visit: Payer: Self-pay | Admitting: Radiology

## 2020-12-14 ENCOUNTER — Other Ambulatory Visit (HOSPITAL_COMMUNITY): Payer: Self-pay | Admitting: Interventional Radiology

## 2020-12-14 ENCOUNTER — Ambulatory Visit (HOSPITAL_COMMUNITY)
Admission: RE | Admit: 2020-12-14 | Discharge: 2020-12-14 | Disposition: A | Discharge status: Home / Self Care | Payer: Commercial Managed Care - PPO | Source: Ambulatory Visit | Attending: Interventional Radiology | Admitting: Interventional Radiology

## 2020-12-14 ENCOUNTER — Encounter (HOSPITAL_COMMUNITY): Payer: Self-pay

## 2020-12-14 ENCOUNTER — Other Ambulatory Visit: Payer: Self-pay

## 2020-12-14 DIAGNOSIS — I651 Occlusion and stenosis of basilar artery: Secondary | ICD-10-CM | POA: Diagnosis not present

## 2020-12-14 DIAGNOSIS — F17210 Nicotine dependence, cigarettes, uncomplicated: Secondary | ICD-10-CM | POA: Diagnosis not present

## 2020-12-14 DIAGNOSIS — Z79899 Other long term (current) drug therapy: Secondary | ICD-10-CM | POA: Diagnosis not present

## 2020-12-14 DIAGNOSIS — I771 Stricture of artery: Secondary | ICD-10-CM

## 2020-12-14 DIAGNOSIS — Z7982 Long term (current) use of aspirin: Secondary | ICD-10-CM | POA: Insufficient documentation

## 2020-12-14 DIAGNOSIS — Z7902 Long term (current) use of antithrombotics/antiplatelets: Secondary | ICD-10-CM | POA: Insufficient documentation

## 2020-12-14 HISTORY — PX: IR ANGIO INTRA EXTRACRAN SEL COM CAROTID INNOMINATE BILAT MOD SED: IMG5360

## 2020-12-14 HISTORY — PX: IR US GUIDE VASC ACCESS RIGHT: IMG2390

## 2020-12-14 HISTORY — PX: IR ANGIO VERTEBRAL SEL VERTEBRAL UNI L MOD SED: IMG5367

## 2020-12-14 LAB — CBC
HCT: 47.6 % (ref 39.0–52.0)
Hemoglobin: 16.1 g/dL (ref 13.0–17.0)
MCH: 31.6 pg (ref 26.0–34.0)
MCHC: 33.8 g/dL (ref 30.0–36.0)
MCV: 93.3 fL (ref 80.0–100.0)
Platelets: 228 10*3/uL (ref 150–400)
RBC: 5.1 MIL/uL (ref 4.22–5.81)
RDW: 13.2 % (ref 11.5–15.5)
WBC: 10.9 10*3/uL — ABNORMAL HIGH (ref 4.0–10.5)
nRBC: 0 % (ref 0.0–0.2)

## 2020-12-14 LAB — BASIC METABOLIC PANEL
Anion gap: 10 (ref 5–15)
BUN: 10 mg/dL (ref 6–20)
CO2: 24 mmol/L (ref 22–32)
Calcium: 9.4 mg/dL (ref 8.9–10.3)
Chloride: 105 mmol/L (ref 98–111)
Creatinine, Ser: 0.85 mg/dL (ref 0.61–1.24)
GFR, Estimated: >60 mL/min (ref >60)
Glucose, Bld: 141 mg/dL — ABNORMAL HIGH (ref 70–99)
Potassium: 4.1 mmol/L (ref 3.5–5.1)
Sodium: 139 mmol/L (ref 135–145)

## 2020-12-14 LAB — PROTIME-INR
INR: 1 (ref 0.8–1.2)
Prothrombin Time: 13.2 seconds (ref 11.4–15.2)

## 2020-12-14 MED ORDER — VERAPAMIL HCL 2.5 MG/ML IV SOLN: INTRA_ARTERIAL | Status: AC | PRN

## 2020-12-14 MED ORDER — MIDAZOLAM HCL 2 MG/2ML IJ SOLN
INTRAMUSCULAR | Status: AC | PRN
  Administered 2020-12-14: 1 mg via INTRAVENOUS

## 2020-12-14 MED ORDER — SODIUM CHLORIDE 0.9 % IV SOLN: Freq: Once | INTRAVENOUS | Status: DC

## 2020-12-14 MED ORDER — NITROGLYCERIN 1 MG/10 ML FOR IR/CATH LAB
INTRA_ARTERIAL | Status: AC
  Filled 2020-12-14: qty 10

## 2020-12-14 MED ORDER — SODIUM CHLORIDE 0.9 % IV SOLN: INTRAVENOUS | Status: AC

## 2020-12-14 MED ORDER — HEPARIN SODIUM (PORCINE) 1000 UNIT/ML IJ SOLN
INTRAMUSCULAR | Status: AC | PRN
  Administered 2020-12-14: 2000 [IU] via INTRA_ARTERIAL

## 2020-12-14 MED ORDER — FENTANYL CITRATE (PF) 100 MCG/2ML IJ SOLN
INTRAMUSCULAR | Status: AC | PRN
  Administered 2020-12-14: 12.5 ug via INTRAVENOUS
  Administered 2020-12-14: 25 ug via INTRAVENOUS

## 2020-12-14 MED ORDER — IOHEXOL 300 MG/ML  SOLN
150.0000 mL | Freq: Once | INTRAMUSCULAR | Status: AC | PRN
  Administered 2020-12-14: 80 mL

## 2020-12-14 MED ORDER — LIDOCAINE HCL 1 % IJ SOLN
INTRAMUSCULAR | Status: AC | PRN
  Administered 2020-12-14: 10 mL

## 2020-12-14 MED ORDER — MIDAZOLAM HCL 2 MG/2ML IJ SOLN
INTRAMUSCULAR | Status: AC
  Filled 2020-12-14: qty 2

## 2020-12-14 MED ORDER — LIDOCAINE HCL 1 % IJ SOLN
INTRAMUSCULAR | Status: AC
  Filled 2020-12-14: qty 20

## 2020-12-14 MED ORDER — NITROGLYCERIN 1 MG/10 ML FOR IR/CATH LAB
INTRA_ARTERIAL | Status: AC | PRN
  Administered 2020-12-14 (×2): 200 ug via INTRA_ARTERIAL

## 2020-12-14 MED ORDER — VERAPAMIL HCL 2.5 MG/ML IV SOLN
INTRAVENOUS | Status: AC
  Filled 2020-12-14: qty 2

## 2020-12-14 MED ORDER — HEPARIN SODIUM (PORCINE) 1000 UNIT/ML IJ SOLN
INTRAMUSCULAR | Status: AC
  Filled 2020-12-14: qty 1

## 2020-12-14 MED ORDER — FENTANYL CITRATE (PF) 100 MCG/2ML IJ SOLN
INTRAMUSCULAR | Status: AC
  Filled 2020-12-14: qty 2

## 2020-12-14 NOTE — Sedation Documentation (Signed)
End tidal capnography removed at this time for diagnostic purposes per Dr. Corliss Skains

## 2020-12-14 NOTE — Discharge Instructions (Addendum)
Radial Site Care  This sheet gives you information about how to care for yourself after your procedure. Your health care provider may also give you more specific instructions. If you have problems or questions, contact your health care provider. What can I expect after the procedure? After the procedure, it is common to have:  Bruising and tenderness at the catheter insertion area. Follow these instructions at home: Medicines  Take over-the-counter and prescription medicines only as told by your health care provider. Insertion site care  Follow instructions from your health care provider about how to take care of your insertion site. Make sure you: ? Wash your hands with soap and water before you change your bandage (dressing). If soap and water are not available, use hand sanitizer. ? Change your dressing as told by your health care provider. ? Leave stitches (sutures), skin glue, or adhesive strips in place. These skin closures may need to stay in place for 2 weeks or longer. If adhesive strip edges start to loosen and curl up, you may trim the loose edges. Do not remove adhesive strips completely unless your health care provider tells you to do that.  Check your insertion site every day for signs of infection. Check for: ? Redness, swelling, or pain. ? Fluid or blood. ? Pus or a bad smell. ? Warmth.  Do not take baths, swim, or use a hot tub until your health care provider approves.  You may shower 24-48 hours after the procedure, or as directed by your health care provider. ? Remove the dressing and gently wash the site with plain soap and water. ? Pat the area dry with a clean towel. ? Do not rub the site. That could cause bleeding.  Do not apply powder or lotion to the site. Activity  For 24 hours after the procedure, or as directed by your health care provider: ? Do not flex or bend the affected arm. ? Do not push or pull heavy objects with the affected arm. ? Do not drive  yourself home from the hospital or clinic. You may drive 24 hours after the procedure unless your health care provider tells you not to. ? Do not operate machinery or power tools.  Do not lift anything that is heavier than 10 lb (4.5 kg), or the limit that you are told, until your health care provider says that it is safe.  Ask your health care provider when it is okay to: ? Return to work or school. ? Resume usual physical activities or sports. ? Resume sexual activity.   General instructions  If the catheter site starts to bleed, raise your arm and put firm pressure on the site. If the bleeding does not stop, get help right away. This is a medical emergency.  If you went home on the same day as your procedure, a responsible adult should be with you for the first 24 hours after you arrive home.  Keep all follow-up visits as told by your health care provider. This is important. Contact a health care provider if:  You have a fever.  You have redness, swelling, or yellow drainage around your insertion site. Get help right away if:  You have unusual pain at the radial site.  The catheter insertion area swells very fast.  The insertion area is bleeding, and the bleeding does not stop when you hold steady pressure on the area.  Your arm or hand becomes pale, cool, tingly, or numb. These symptoms may represent a serious   problem that is an emergency. Do not wait to see if the symptoms will go away. Get medical help right away. Call your local emergency services (911 in the [place] Do not drive yourself to the hospital. Summary  After the procedure, it is common to have bruising and tenderness at the site.  Follow instructions from your health care provider about how to take care of your radial site wound. Check the wound every day for signs of infection.  Do not lift anything that is heavier than 10 lb (4.5 kg), or the limit that you are told, until your health care provider says that it  is safe. This information is not intended to replace advice given to you by your health care provider. Make sure you discuss any questions you have with your health care provider. Document Revised: 11/12/2017 Document Reviewed: 11/12/2017 Elsevier Patient Education  2021 Else[employer] Inc. Cerebral Angiogram, Care After This sheet gives you information about how to care for yourself after your procedure. Your health care provider may also give you more specific instructions. If you have problems or questions, contact your health care provider. What can I expect after the procedure? After the procedure, it is common to have:  Bruising and tenderness at the catheter insertion site.  A mild headache. Follow these instructions at home: Insertion site care  Follow instructions from your health care provider about how to take care of the insertion site. Make sure you: ? Wash your hands with soap and water before and after you change your bandage (dressing). If soap and water are not available, use hand sanitizer. ? Change your dressing as told by your health care provider.  Do not take baths, swim, or use a hot tub until your health care provider approves. You may shower 24-48 hours after the procedure, or as told by your health care provider.  To clean your insertion site: ? Gently wash the site with plain soap and water. ? Pat the area dry with a clean towel. ? Do not rub the site. This may cause bleeding.  Do not apply powder or lotion to the site. Keep the site clean and dry. Infection signs Check your incision area every day for signs of infection. Check for:  Redness, swelling, or pain.  Fluid or blood.  Warmth.  Pus or a bad smell.   Activity  Do not drive for 24 hours if you were given a sedative during your procedure.  Rest as told by your health care provider.  Do not lift anything that is heavier than 10 lb (4.5 kg), or the limit that you are told, until your health care  provider says that it is safe.  Return to your normal activities as told by your health care provider, usually in about a week. Ask your health care provider what activities are safe for you. General instructions  If your insertion site starts to bleed, lie flat and put pressure on the site. If the bleeding does not stop, get help right away. This is a medical emergency.  Do not use any products that contain nicotine or tobacco, such as cigarettes, e-cigarettes, and chewing tobacco. If you need help quitting, ask your health care provider.  Take over-the-counter and prescription medicines only as told by your health care provider.  Drink enough fluid to keep your urine pale yellow. This helps flush the contrast dye from your body.  Keep all follow-up visits as directed by your health care provider. This is important.     Contact a health care provider if:  You have a fever or chills.  You have redness, swelling, or pain around your insertion site.  You have fluid or blood coming from your insertion site.  The insertion site feels warm to the touch.  You have pus or a bad smell coming from your insertion site.  You notice blood collecting in the tissue around the insertion site (hematoma). The hematoma may be painful to the touch. Get help right away if:  You have chest pain or trouble breathing.  You have severe pain or swelling at the insertion site.  The insertion area bleeds, and bleeding continues after 30 minutes of holding steady pressure on the site.  The arm or leg where the catheter was inserted is pale, cold, numb, tingling, or weak.  You have a rash.  You have any symptoms of a stroke. "BE FAST" is an easy way to remember the main warning signs of a stroke: ? B - Balance. Signs are dizziness, sudden trouble walking, or loss of balance. ? E - Eyes. Signs are trouble seeing or a sudden change in vision. ? F - Face. Signs are sudden weakness or numbness of the face, or  the face or eyelid drooping on one side. ? A - Arms. Signs are weakness or numbness in an arm. This happens suddenly and usually on one side of the body. ? S - Speech. Signs are sudden trouble speaking, slurred speech, or trouble understanding what people say. ? T - Time. Time to call emergency services. Write down what time symptoms started.  You have other signs of a stroke, such as: ? A sudden, severe headache with no known cause. ? Nausea or vomiting. ? Seizure. These symptoms may represent a serious problem that is an emergency. Do not wait to see if the symptoms will go away. Get medical help right away. Call your local emergency services (911 in the [place] Do not drive yourself to the hospital. Summary  Bruising and tenderness at the insertion site are common.  Follow your health care provider's instructions about caring for your insertion site. Change dressing and clean the area as instructed.  If your insertion site bleeds, apply direct pressure until bleeding stops.  Return to your normal activities as told by your health care provider. Ask what activities are safe.  Rest and drink plenty of fluids. This information is not intended to replace advice given to you by your health care provider. Make sure you discuss any questions you have with your health care provider. Document Revised: 04/27/2019 Document Reviewed: 04/27/2019 Elsevier Patient Education  2021 Elsevier Inc. Moderate Conscious Sedation, Adult Sedation is the use of medicines to promote relaxation and to relieve discomfort and anxiety. Moderate conscious sedation is a type of sedation. Under moderate conscious sedation, you are less alert than normal, but you are still able to respond to instructions, touch, or both. Moderate conscious sedation is used during short medical and dental procedures. It is milder than deep sedation, which is a type of sedation under which you cannot be easily woken up. It is also milder  than general anesthesia, which is the use of medicines to make you unconscious. Moderate conscious sedation allows you to return to your regular activities sooner. Tell a health care provider about:  Any allergies you have.  All medicines you are taking, including vitamins, herbs, eye drops, creams, and over-the-counter medicines.  Any use of steroids. This includes steroids taken by mouth or as a   cream.  Any problems you or family members have had with sedatives and anesthetic medicines.  Any blood disorders you have.  Any surgeries you have had.  Any medical conditions you have, such as sleep apnea.  Whether you are pregnant or may be pregnant.  Any use of cigarettes, alcohol, marijuana, or drugs. What are the risks? Generally, this is a safe procedure. However, problems may occur, including:  Getting too much medicine (oversedation).  Nausea.  Allergic reaction to medicines.  Trouble breathing. If this happens, a breathing tube may be used. It will be removed when you are awake and breathing on your own.  Heart trouble.  Lung trouble.  Confusion that gets better with time (emergence delirium). What happens before the procedure? Staying hydrated Follow instructions from your health care provider about hydration, which may include:  Up to 2 hours before the procedure - you may continue to drink clear liquids, such as water, clear fruit juice, black coffee, and plain tea. Eating and drinking restrictions Follow instructions from your health care provider about eating and drinking, which may include:  8 hours before the procedure - stop eating heavy meals or foods, such as meat, fried foods, or fatty foods.  6 hours before the procedure - stop eating light meals or foods, such as toast or cereal.  6 hours before the procedure - stop drinking milk or drinks that contain milk.  2 hours before the procedure - stop drinking clear liquids. Medicines Ask your health care  provider about:  Changing or stopping your regular medicines. This is especially important if you are taking diabetes medicines or blood thinners.  Taking medicines such as aspirin and ibuprofen. These medicines can thin your blood. Do not take these medicines unless your health care provider tells you to take them.  Taking over-the-counter medicines, vitamins, herbs, and supplements. Tests and exams  You will have a physical exam.  You may have blood tests done to show how well: ? Your kidneys and liver work. ? Your blood clots. General instructions  Plan to have a responsible adult take you home from the hospital or clinic.  If you will be going home right after the procedure, plan to have a responsible adult care for you for the time you are told. This is important. What happens during the procedure?  You will be given the sedative. The sedative may be given: ? As a pill that you will swallow. It can also be inserted into the rectum. ? As a spray through the nose. ? As an injection into the muscle. ? As an injection into the vein through an IV.  You may be given oxygen as needed.  Your breathing, heart rate, and blood pressure will be monitored during the procedure.  The medical or dental procedure will be done. The procedure may vary among health care providers and hospitals.   What happens after the procedure?  Your blood pressure, heart rate, breathing rate, and blood oxygen level will be monitored until you leave the hospital or clinic.  You will get fluids through your IV if needed.  Do not drive or operate machinery until your health care provider says that it is safe. Summary  Sedation is the use of medicines to promote relaxation and to relieve discomfort and anxiety. Moderate conscious sedation is a type of sedation that is used during short medical and dental procedures.  Tell the health care provider about any medical conditions that you have and about all  the   medicines that you are taking.  You will be given the sedative as a pill, a spray through the nose, an injection into the muscle, or an injection into the vein through an IV. Vital signs are monitored during the sedation.  Moderate conscious sedation allows you to return to your regular activities sooner. This information is not intended to replace advice given to you by your health care provider. Make sure you discuss any questions you have with your health care provider. Document Revised: 02/04/2020 Document Reviewed: 09/02/2019 Elsevier Patient Education  2021 Elsevier Inc.  

## 2020-12-14 NOTE — Procedures (Signed)
S/P 4 vessel cerebral arteriogram Rt Rad approach. 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 S.Judaea Burgoon MD

## 2020-12-14 NOTE — Sedation Documentation (Signed)
Patient transported to short stay. Darreld Mclean RN at the bedside. Right wrist assist. TR Band in place. Clean, dry and intact. No drainage from site. Palpable radial pulse intact +2

## 2020-12-14 NOTE — Progress Notes (Signed)
Discharge instructions reviewed with pt and his wife (via telephone) both voices understanding.  

## 2020-12-14 NOTE — H&P (Signed)
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 male with past medical history of DM2 who presented to [facility] 10/09/20 with fatigue, upper and lower extremity weakness, and slurred speech. He was transferred to [facility] as a CODE STROKE.  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 His symptoms resolved within 24 hrs and he was discharge home with medical management and Neurology follow-up.  He was also referred to Neurointerventional Radiology to discuss his intra-cranial stenoses. He was seen in consultation with Dr. Estanislado Pandy 11/02/20 to discuss management of his intra-cranial stenoses.  He elects to proceed with diagnostic angiogram.   Fred Hughes presents to [facility] Radiology in his usual state of health today.   He was discharged from the hospital on Plavix 75 mg daily, aspirin 325 mg daily, and lipitor.  He reports he takes the Plavix $RemoveBe'75mg'OOcDLover$  daily as well as lipitor, however is inconsistent with the aspirin due to easy bleeding.  He presents for diagnostic angiogram today and understands the goals of the procedure.  He denies any recurrence of his stroke-like symptoms including lightheadedness, headache, dizziness, nausea, vomiting, vision trouble, speech difficulty, trouble chewing or swallowing, weakness, or gait disturbance. He has been NPO.   Past Medical History:  Diagnosis Date  . Diabetes mellitus without complication [facility])     Past Surgical History:  Procedure Laterality Date  . IR RADIOLOGIST EVAL & MGMT  11/06/2020    Allergies: Patient has no known allergies.  Medications: Prior to Admission medications   Medication Sig Start Date End Date  Taking? Authorizing Provider  aspirin 325 MG tablet Take 1 tablet (325 mg total) by mouth daily. 10/11/20  Yes Vonzella Nipple, NP  atorvastatin (LIPITOR) 80 MG tablet Take 1 tablet (80 mg total) by mouth daily. 10/11/20  Yes Vonzella Nipple, NP  clopidogrel (PLAVIX) 75 MG tablet Take 1 tablet (75 mg total) by mouth daily. 10/11/20  Yes Vonzella Nipple, NP  famotidine (PEPCID) 20 MG tablet Take 20 mg by mouth daily as needed for heartburn or indigestion.   Yes [provider]  naproxen sodium (ALEVE) 220 MG tablet Take 440 mg by mouth daily as needed (pain).   Yes [provider]     History reviewed. No pertinent family history.  Social History   Socioeconomic History  . Marital status: Married    Spouse name: Not on file  . Number of children: Not on file  . Years of education: Not on file  . Highest education level: Not on file  Occupational History  . Not on file  Tobacco Use  . Smoking status: Current Every Day Smoker    Packs/day: 0.50    Types: Cigarettes  . Smokeless tobacco: Never Used  Substance and Sexual Activity  . Alcohol use: Never  . Drug use: Never  . Sexual activity: Yes  Other Topics Concern  . Not on file  Social History Narrative  . Not on file   Social Determinants of Health   Financial Resource Strain: Not on file  Food Insecurity: Not on file  Transportation Needs: Not on file  Physical Activity: Not on file  Stress: Not on file  Social Connections: Not on file  Review of Systems: A 12 point ROS discussed and pertinent positives are indicated in the HPI above.  All other systems are negative.  Review of Systems  Constitutional: Negative for fatigue and fever.  Respiratory: Negative for cough and shortness of breath.   Cardiovascular: Negative for chest pain.  Gastrointestinal: Negative for abdominal pain, diarrhea, nausea and vomiting.  Genitourinary: Negative for dysuria.  Musculoskeletal: Negative for back  pain.  Neurological: Negative for dizziness, facial asymmetry, weakness and headaches.  Hematological: Negative for adenopathy.  Psychiatric/Behavioral: Negative for behavioral problems and confusion.    Vital Signs: BP (!) 136/96   Pulse 84   Temp 98.5 F (36.9 C) (Oral)   Resp 20   Ht $R'5\' 8"'Bl$  (1.727 m)   Wt 190 lb (86.2 kg)   SpO2 100%   BMI 28.89 kg/m   Physical Exam Vitals and nursing note reviewed.  Constitutional:      General: He is not in acute distress.    Appearance: Normal appearance. He is normal weight. He is not ill-appearing.  HENT:     Mouth/Throat:     Mouth: Mucous membranes are moist.     Pharynx: Oropharynx is clear.  Cardiovascular:     Rate and Rhythm: Normal rate and regular rhythm.  Pulmonary:     Effort: Pulmonary effort is normal.     Breath sounds: Normal breath sounds.  Abdominal:     General: Abdomen is flat.     Palpations: Abdomen is soft.  Skin:    General: Skin is warm and dry.  Neurological:     General: No focal deficit present.     Mental Status: He is alert and oriented to person, place, and time. Mental status is at baseline.  Psychiatric:        Mood and Affect: Mood normal.        Behavior: Behavior normal.        Thought Content: Thought content normal.        Judgment: Judgment normal.      MD Evaluation Airway: WNL Heart: WNL Abdomen: WNL Chest/ Lungs: WNL ASA  Classification: 3 Mallampati/Airway Score: Two   Imaging: No results found.  Labs:  CBC: Recent Labs    10/09/20 1019 10/10/20 0104 12/14/20 0650  WBC 11.3* 9.0 10.9*  HGB 16.8 16.0 16.1  HCT 49.1 47.2 47.6  PLT 211 195 228    COAGS: Recent Labs    10/09/20 1019 12/14/20 0650  INR 1.0 1.0  APTT 29  --     BMP: Recent Labs    10/09/20 1019 10/10/20 0104 12/14/20 0650  NA 137  --  139  K 4.4  --  4.1  CL 102  --  105  CO2 28  --  24  GLUCOSE 160*  --  141*  BUN 9  --  10  CALCIUM 9.1  --  9.4  CREATININE 0.91 0.88 0.85   GFRNONAA >60 >60 >60    LIVER FUNCTION TESTS: Recent Labs    10/09/20 1019  BILITOT 0.6  AST 17  ALT 19  ALKPHOS 47  PROT 7.2  ALBUMIN 4.0    TUMOR MARKERS: No results for input(s): AFPTM, CEA, CA199, CHROMGRNA in the last 8760 hours.  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  IR consulted for diagnostic angiogram at the request of Dr. Leonie Man. Case reviewed by Dr. Estanislado Pandy who has met with the  patient consultation and who approves patient for procedure.  Patient presents today in their usual state of health.  He has been NPO and is currently on Plavix which he last took last night.    Risks and benefits of angiogram were discussed with the patient including, but not limited to bleeding, infection, vascular injury or contrast induced renal failure.  This interventional procedure involves the use of X-rays and because of the nature of the planned procedure, it is possible that we will have prolonged use of X-ray fluoroscopy.  Potential radiation risks to you include (but are not limited to) the following: - A slightly elevated risk for cancer  several years later in life. This risk is typically less than 0.5% percent. This risk is low in comparison to the normal incidence of human cancer, which is 33% for women and 50% for men according to the [place]. - Radiation induced injury can include skin redness, resembling a rash, tissue breakdown / ulcers and hair loss (which can be temporary or permanent).   The likelihood of either of these occurring depends on the difficulty of the procedure and whether you are sensitive to radiation due to previous procedures, disease, or genetic conditions.   IF your procedure requires a prolonged use of radiation, you will be notified and given written instructions for further action.  It is your  responsibility to monitor the irradiated area for the 2 weeks following the procedure and to notify your physician if you are concerned that you have suffered a radiation induced injury.    All of the patient's questions were answered, patient is agreeable to proceed.  Consent signed and in chart.  Thank you for this interesting consult.  I greatly enjoyed meeting Fred Hughes and look forward to participating in their care.  A copy of this report was sent to the requesting provider on this date.  Electronically Signed: Docia Barrier, PA 12/14/2020, 8:15 AM   I spent a total of  30 Minutes   in face to face in clinical consultation, greater than 50% of which was counseling/coordinating care for basilar artery stenosis.

## 2022-01-16 ENCOUNTER — Telehealth (HOSPITAL_COMMUNITY): Payer: Self-pay

## 2022-01-16 ENCOUNTER — Other Ambulatory Visit (HOSPITAL_COMMUNITY): Payer: Self-pay | Admitting: Interventional Radiology

## 2022-01-16 DIAGNOSIS — I771 Stricture of artery: Secondary | ICD-10-CM

## 2022-01-16 NOTE — Telephone Encounter (Signed)
Called to schedule cta head and neck. Pt says that his schedule is crazy right now. He will call back to schedule. AW  ?

## 2022-06-01 IMAGING — US IR ANGIO INTRA EXTRACRAN SEL COM CAROTID INNOMINATE BILAT MOD SE
1 series · 4 of 4 positions shown · non-contrast
Comparison: CT angiogram of the head and neck October 09, 2020.

CLINICAL DATA: History of left paramedian pontine stroke with
resultant dysarthria, and incoordination. Abnormal CT angiogram of
the head and neck.

EXAM:
BILATERAL COMMON CAROTID AND INNOMINATE ANGIOGRAPHY
TECHNIQUE: Informed written consent was obtained from the patient after a
thorough discussion of the procedural risks, benefits and
alternatives. All questions were addressed. Maximal Sterile Barrier
Technique was utilized including caps, mask, sterile gowns, sterile
gloves, sterile drape, hand hygiene and skin antiseptic. A timeout
was performed prior to the initiation of the procedure.

[Series 1: ir (id) (id) · 4 of 4 slices shown]
[im 1/4]
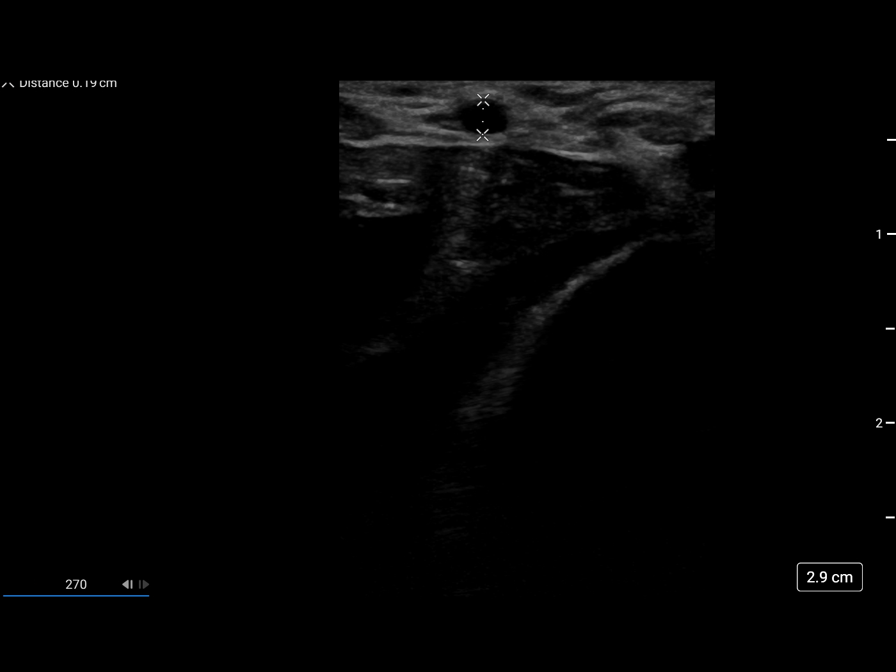
[im 2/4]
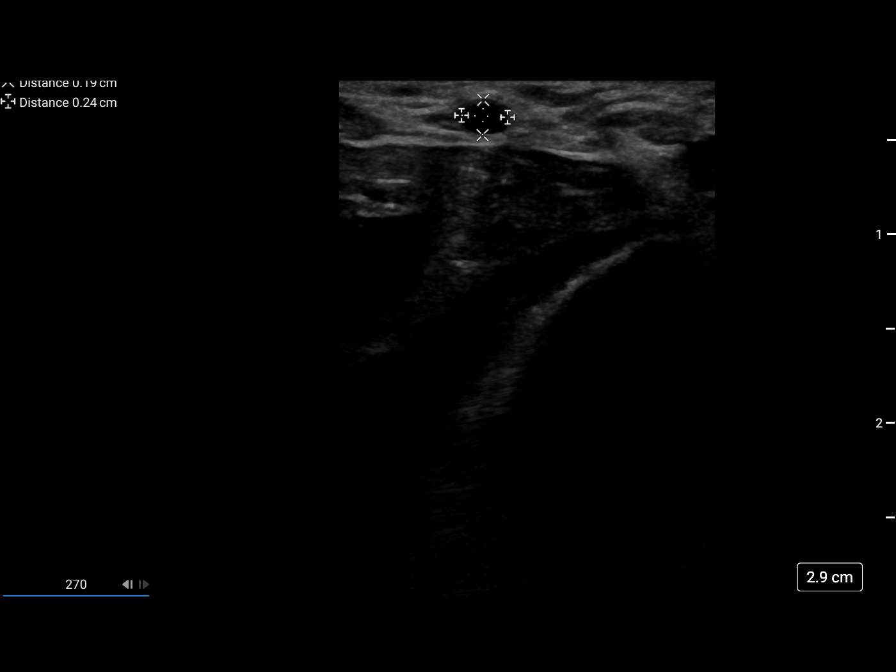
[im 3/4]
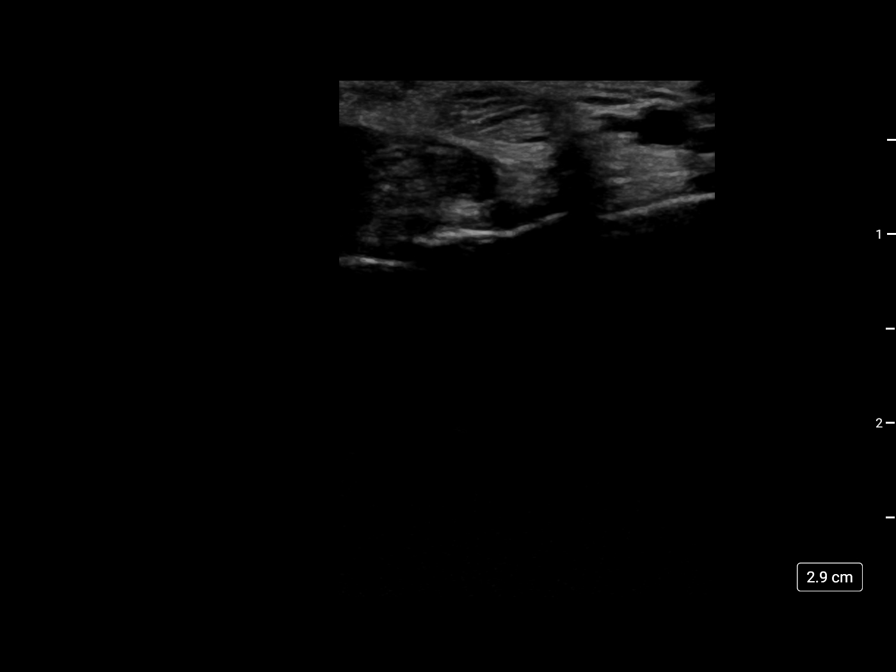
[im 4/4]
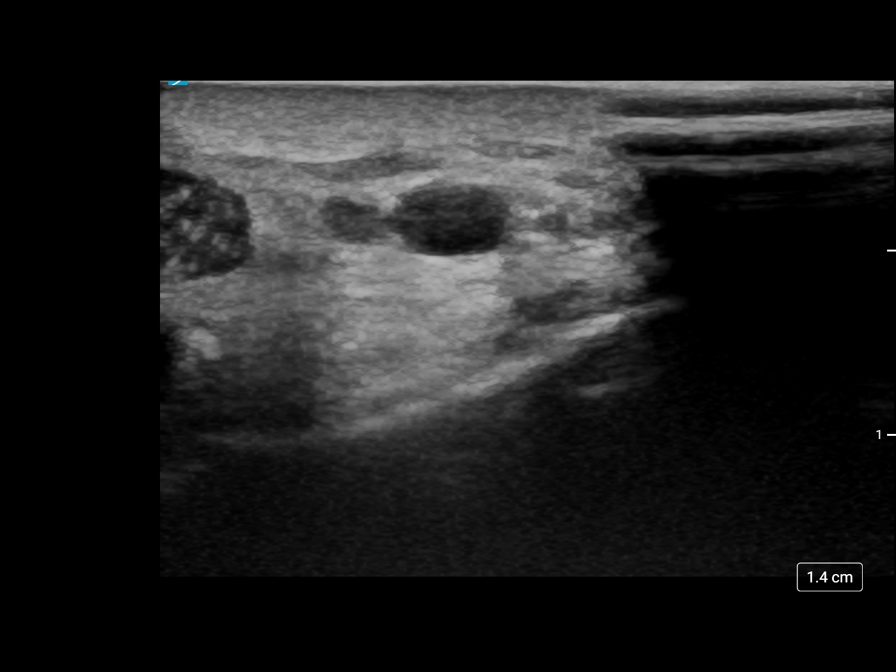

[4 of 4 positions shown; findings below may reference images not displayed]

MEDICATIONS:
Heparin 7888 units IA. No antibiotic was administered within 1 hour
of the procedure.

ANESTHESIA/SEDATION:
Versed 1 mg IV; Fentanyl 37.5 mcg IV

Moderate Sedation Time:  40 minutes

The patient was continuously monitored during the procedure by the
interventional radiology nurse under my direct supervision.

CONTRAST:  Omnipaque 300 approximately 65 mL

FLUOROSCOPY TIME:  Fluoroscopy Time: 11 minutes 54 seconds (781.9
mGy).

COMPLICATIONS:
None immediate.
The right forearm to the wrist was prepped and draped in the usual
sterile manner.

The right radial artery was then identified with ultrasound, and its
morphology documented. A dorsal palmar anastomosis was verified to
be present. Using ultrasound guidance radial access was obtained
with a micropuncture set. Over a 0.018 inch micro guidewire, a [DATE]
French radial sheath was inserted. The obturator, and micro
guidewire were removed. Free aspiration was obtained from the side
port of the sheath. A cocktail of 7888 units of heparin, 200 mcg of
nitroglycerin, and 2.5 mg of verapamil in diluted form was then
infused through the sheath without event. A right radial arteriogram
was then performed.

Over a 0.035 inch Roadrunner guidewire, Celestin Debernardi 2 diagnostic 5
French catheter was then advanced to the aortic arch region, and
selectively positioned at the origin of the right vertebral artery,
the right common carotid artery, the left common carotid artery and
the left vertebral artery.

Following the procedure, hemostasis at the right radial puncture
site was achieved with a wrist band. Distal right radial pulse was
verified to be present.
FINDINGS: The non dominant right vertebral artery origin is widely patent.

The vessel is seen to opacify to the cranial skull base where it
ends primarily in the ipsilateral right posterior-inferior
cerebellar artery.

The right common carotid arteriogram demonstrates the right external
carotid artery and its major branches to be widely patent.

The right internal carotid artery at the bulb to the cranial skull
base is widely patent.

The petrous, cavernous and supraclinoid segments are widely patent.

Right posterior communicating artery is seen opacifying the right
posterior cerebral artery distribution.

The right middle cerebral artery and the right anterior cerebral
artery opacify into the capillary and venous phases. Transient
cross-filling via the anterior communicating artery of the left
anterior cerebral A2 segment is seen. The left common carotid
arteriogram demonstrates the left external carotid artery and its
major branches to be widely patent.

The left internal carotid artery at the bulb to the cranial skull
base is also widely patent.

The petrous, cavernous and supraclinoid segments are widely patent.

The left middle cerebral artery and the left anterior cerebral
artery opacify into the capillary and venous phases.

The dominant left vertebral artery origin is widely patent.

The vessel opacify to the cranial skull base. Wide patency is seen
of the left vertebrobasilar junction and the left posterior-inferior
cerebellar artery.

The basilar artery, the posterior cerebral arteries, the superior
cerebellar arteries and the anterior-inferior cerebellar arteries
opacify into the capillary and venous phases. Retrograde
opacification of the non dominant right vertebrobasilar junction to
the right posterior-inferior cerebellar artery is seen from the left
vertebral artery injection.
IMPRESSION: Angiographically no evidence of intracranial stenosis, dissections,
occlusions, or of arteriovenous shunting.

PLAN:
Findings reviewed with the patient.

Patient advised to maintain dual antiplatelets for at least 6
months. Thereafter to stop Plavix and to continue regular 325
aspirin per day.

Follow-up CT angiogram of the head and neck in a years time. Should
the patient develop stroke-like symptoms the patient advised to call
[phone]. Patient expressed understanding and agreement with the above
management plan.

## 2024-04-16 ENCOUNTER — Encounter (HOSPITAL_COMMUNITY): Payer: Self-pay

## 2024-04-16 ENCOUNTER — Encounter (HOSPITAL_COMMUNITY): Payer: Self-pay | Admitting: Interventional Radiology
